# Patient Record
Sex: Female | Born: 1980 | Race: Black or African American | Hispanic: No | Marital: Single | Smoking: Never smoker
Health system: Southern US, Community
[De-identification: ages and names within clinical notes are randomized; demographics above are authoritative.]

## PROBLEM LIST (undated history)

## (undated) ENCOUNTER — Inpatient Hospital Stay (HOSPITAL_COMMUNITY): Payer: Self-pay

## (undated) DIAGNOSIS — R42 Dizziness and giddiness: Secondary | ICD-10-CM

## (undated) DIAGNOSIS — O3430 Maternal care for cervical incompetence, unspecified trimester: Secondary | ICD-10-CM

## (undated) DIAGNOSIS — O4700 False labor before 37 completed weeks of gestation, unspecified trimester: Secondary | ICD-10-CM

## (undated) DIAGNOSIS — H6093 Unspecified otitis externa, bilateral: Secondary | ICD-10-CM

## (undated) DIAGNOSIS — N760 Acute vaginitis: Secondary | ICD-10-CM

## (undated) DIAGNOSIS — R51 Headache: Secondary | ICD-10-CM

## (undated) DIAGNOSIS — O4690 Antepartum hemorrhage, unspecified, unspecified trimester: Secondary | ICD-10-CM

## (undated) DIAGNOSIS — N390 Urinary tract infection, site not specified: Secondary | ICD-10-CM

## (undated) DIAGNOSIS — B9689 Other specified bacterial agents as the cause of diseases classified elsewhere: Secondary | ICD-10-CM

## (undated) DIAGNOSIS — A599 Trichomoniasis, unspecified: Secondary | ICD-10-CM

## (undated) HISTORY — PX: CRYOABLATION: SHX1415

---

## 2003-11-01 ENCOUNTER — Emergency Department (HOSPITAL_COMMUNITY): Admission: EM | Admit: 2003-11-01 | Discharge: 2003-11-01 | Payer: Self-pay | Admitting: Emergency Medicine

## 2003-11-08 ENCOUNTER — Ambulatory Visit: Payer: Self-pay | Admitting: *Deleted

## 2003-11-19 ENCOUNTER — Ambulatory Visit: Payer: Self-pay | Admitting: Obstetrics and Gynecology

## 2004-05-12 ENCOUNTER — Emergency Department (HOSPITAL_COMMUNITY): Admission: EM | Admit: 2004-05-12 | Discharge: 2004-05-13 | Payer: Self-pay | Admitting: Emergency Medicine

## 2004-10-24 ENCOUNTER — Emergency Department (HOSPITAL_COMMUNITY): Admission: EM | Admit: 2004-10-24 | Discharge: 2004-10-25 | Payer: Self-pay | Admitting: Emergency Medicine

## 2004-11-05 ENCOUNTER — Emergency Department (HOSPITAL_COMMUNITY): Admission: EM | Admit: 2004-11-05 | Discharge: 2004-11-05 | Payer: Self-pay | Admitting: Emergency Medicine

## 2005-04-17 ENCOUNTER — Emergency Department (HOSPITAL_COMMUNITY): Admission: EM | Admit: 2005-04-17 | Discharge: 2005-04-17 | Payer: Self-pay | Admitting: Emergency Medicine

## 2005-07-22 ENCOUNTER — Emergency Department (HOSPITAL_COMMUNITY): Admission: EM | Admit: 2005-07-22 | Discharge: 2005-07-23 | Payer: Self-pay | Admitting: Emergency Medicine

## 2005-09-04 ENCOUNTER — Emergency Department (HOSPITAL_COMMUNITY): Admission: EM | Admit: 2005-09-04 | Discharge: 2005-09-04 | Payer: Self-pay | Admitting: Emergency Medicine

## 2005-12-01 ENCOUNTER — Emergency Department (HOSPITAL_COMMUNITY): Admission: EM | Admit: 2005-12-01 | Discharge: 2005-12-02 | Payer: Self-pay | Admitting: Emergency Medicine

## 2006-01-07 ENCOUNTER — Emergency Department (HOSPITAL_COMMUNITY): Admission: EM | Admit: 2006-01-07 | Discharge: 2006-01-07 | Payer: Self-pay | Admitting: Emergency Medicine

## 2006-02-18 DIAGNOSIS — I10 Essential (primary) hypertension: Secondary | ICD-10-CM

## 2006-07-12 ENCOUNTER — Emergency Department (HOSPITAL_COMMUNITY): Admission: EM | Admit: 2006-07-12 | Discharge: 2006-07-12 | Payer: Self-pay | Admitting: Emergency Medicine

## 2006-09-13 ENCOUNTER — Ambulatory Visit: Payer: Self-pay | Admitting: Internal Medicine

## 2006-09-13 DIAGNOSIS — R1013 Epigastric pain: Secondary | ICD-10-CM | POA: Insufficient documentation

## 2006-09-13 DIAGNOSIS — R0602 Shortness of breath: Secondary | ICD-10-CM

## 2006-09-13 LAB — CONVERTED CEMR LAB
ALT: 12 units/L (ref 0–35)
AST: 21 units/L (ref 0–37)
Albumin: 4.6 g/dL (ref 3.5–5.2)
Alkaline Phosphatase: 51 units/L (ref 39–117)
BUN: 11 mg/dL (ref 6–23)
Basophils Absolute: 0 10*3/uL (ref 0.0–0.1)
Basophils Relative: 0 % (ref 0–1)
CO2: 17 meq/L — ABNORMAL LOW (ref 19–32)
Calcium: 9.1 mg/dL (ref 8.4–10.5)
Chloride: 104 meq/L (ref 96–112)
Cholesterol: 174 mg/dL (ref 0–200)
Creatinine, Ser: 0.67 mg/dL (ref 0.40–1.20)
Eosinophils Absolute: 0.1 10*3/uL (ref 0.0–0.7)
Eosinophils Relative: 2 % (ref 0–5)
Glucose, Bld: 75 mg/dL (ref 70–99)
HCT: 37.5 % (ref 36.0–46.0)
HDL: 43 mg/dL (ref >39)
Hemoglobin: 12.1 g/dL (ref 12.0–15.0)
LDL Cholesterol: 117 mg/dL — ABNORMAL HIGH (ref 0–99)
Lymphocytes Relative: 34 % (ref 12–46)
Lymphs Abs: 2.4 10*3/uL (ref 0.7–3.3)
MCHC: 32.3 g/dL (ref 30.0–36.0)
MCV: 80.5 fL (ref 78.0–100.0)
Monocytes Absolute: 0.7 10*3/uL (ref 0.2–0.7)
Monocytes Relative: 9 % (ref 3–11)
Neutro Abs: 3.9 10*3/uL (ref 1.7–7.7)
Neutrophils Relative %: 55 % (ref 43–77)
Platelets: 192 10*3/uL (ref 150–400)
Potassium: 4.6 meq/L (ref 3.5–5.3)
RBC: 4.66 M/uL (ref 3.87–5.11)
RDW: 15.6 % — ABNORMAL HIGH (ref 11.5–14.0)
Sodium: 138 meq/L (ref 135–145)
Total Bilirubin: 0.5 mg/dL (ref 0.3–1.2)
Total CHOL/HDL Ratio: 4
Total Protein: 7.9 g/dL (ref 6.0–8.3)
Triglycerides: 68 mg/dL (ref <150)
VLDL: 14 mg/dL (ref 0–40)
WBC: 7.2 10*3/uL (ref 4.0–10.5)

## 2006-09-21 ENCOUNTER — Ambulatory Visit: Payer: Self-pay | Admitting: *Deleted

## 2007-01-05 ENCOUNTER — Telehealth (INDEPENDENT_AMBULATORY_CARE_PROVIDER_SITE_OTHER): Payer: Self-pay | Admitting: Internal Medicine

## 2009-08-20 ENCOUNTER — Encounter (INDEPENDENT_AMBULATORY_CARE_PROVIDER_SITE_OTHER): Payer: Self-pay | Admitting: Internal Medicine

## 2009-12-25 ENCOUNTER — Emergency Department (HOSPITAL_COMMUNITY): Admission: EM | Admit: 2009-12-25 | Discharge: 2009-08-18 | Payer: Self-pay | Admitting: Emergency Medicine

## 2010-02-17 NOTE — Letter (Signed)
Summary: FAXED REQUESTED RECORDS TO [facility] DE[facility].  FAXED REQUESTED RECORDS TO [facility] DEPT.   Imported By: Arta Bruce 09/10/2009 15:16:17  _____________________________________________________________________  External Attachment:    Type:   Image     Comment:   External Document

## 2010-04-03 LAB — DIFFERENTIAL
Basophils Absolute: 0 10*3/uL (ref 0.0–0.1)
Basophils Relative: 0 % (ref 0–1)
Eosinophils Absolute: 0 10*3/uL (ref 0.0–0.7)
Eosinophils Relative: 1 % (ref 0–5)
Lymphocytes Relative: 35 % (ref 12–46)
Lymphs Abs: 1.7 10*3/uL (ref 0.7–4.0)
Monocytes Absolute: 0.3 10*3/uL (ref 0.1–1.0)
Monocytes Relative: 6 % (ref 3–12)
Neutro Abs: 2.8 10*3/uL (ref 1.7–7.7)
Neutrophils Relative %: 59 % (ref 43–77)

## 2010-04-03 LAB — POCT CARDIAC MARKERS
CKMB, poc: 3.5 ng/mL (ref 1.0–8.0)
Myoglobin, poc: 110 ng/mL (ref 12–200)
Troponin i, poc: <0.05 ng/mL (ref 0.00–0.09)

## 2010-04-03 LAB — CBC
HCT: 33.5 % — ABNORMAL LOW (ref 36.0–46.0)
Hemoglobin: 11.8 g/dL — ABNORMAL LOW (ref 12.0–15.0)
MCH: 27.6 pg (ref 26.0–34.0)
MCHC: 35.2 g/dL (ref 30.0–36.0)
MCV: 78.6 fL (ref 78.0–100.0)
Platelets: 253 10*3/uL (ref 150–400)
RBC: 4.27 MIL/uL (ref 3.87–5.11)
RDW: 14.6 % (ref 11.5–15.5)
WBC: 4.8 10*3/uL (ref 4.0–10.5)

## 2010-04-03 LAB — POCT I-STAT, CHEM 8
BUN: 8 mg/dL (ref 6–23)
Calcium, Ion: 1.12 mmol/L (ref 1.12–1.32)
Chloride: 103 mEq/L (ref 96–112)
Creatinine, Ser: 0.7 mg/dL (ref 0.4–1.2)
Glucose, Bld: 90 mg/dL (ref 70–99)
HCT: 36 % (ref 36.0–46.0)
Hemoglobin: 12.2 g/dL (ref 12.0–15.0)
Potassium: 3.3 mEq/L — ABNORMAL LOW (ref 3.5–5.1)
Sodium: 138 mEq/L (ref 135–145)
TCO2: 23 mmol/L (ref 0–100)

## 2010-04-03 LAB — POCT PREGNANCY, URINE: Preg Test, Ur: NEGATIVE

## 2010-04-03 LAB — GLUCOSE, CAPILLARY: Glucose-Capillary: 88 mg/dL (ref 70–99)

## 2010-05-01 ENCOUNTER — Emergency Department (HOSPITAL_COMMUNITY): Payer: Self-pay

## 2010-05-01 ENCOUNTER — Emergency Department (HOSPITAL_COMMUNITY)
Admission: EM | Admit: 2010-05-01 | Discharge: 2010-05-01 | Disposition: A | Discharge status: Home / Self Care | Payer: Self-pay | Attending: Emergency Medicine | Admitting: Emergency Medicine

## 2010-05-01 DIAGNOSIS — H8110 Benign paroxysmal vertigo, unspecified ear: Secondary | ICD-10-CM | POA: Insufficient documentation

## 2010-05-01 DIAGNOSIS — I10 Essential (primary) hypertension: Secondary | ICD-10-CM | POA: Insufficient documentation

## 2010-05-01 DIAGNOSIS — R51 Headache: Secondary | ICD-10-CM | POA: Insufficient documentation

## 2010-05-01 DIAGNOSIS — R55 Syncope and collapse: Secondary | ICD-10-CM | POA: Insufficient documentation

## 2010-05-01 DIAGNOSIS — R232 Flushing: Secondary | ICD-10-CM | POA: Insufficient documentation

## 2010-05-01 DIAGNOSIS — R5381 Other malaise: Secondary | ICD-10-CM | POA: Insufficient documentation

## 2010-05-01 DIAGNOSIS — R110 Nausea: Secondary | ICD-10-CM | POA: Insufficient documentation

## 2010-05-01 LAB — DIFFERENTIAL
Basophils Absolute: 0 10*3/uL (ref 0.0–0.1)
Basophils Relative: 0 % (ref 0–1)
Eosinophils Absolute: 0 10*3/uL (ref 0.0–0.7)
Eosinophils Relative: 1 % (ref 0–5)
Lymphocytes Relative: 33 % (ref 12–46)
Lymphs Abs: 2 10*3/uL (ref 0.7–4.0)
Monocytes Absolute: 0.4 10*3/uL (ref 0.1–1.0)
Monocytes Relative: 6 % (ref 3–12)
Neutro Abs: 3.7 10*3/uL (ref 1.7–7.7)
Neutrophils Relative %: 61 % (ref 43–77)

## 2010-05-01 LAB — POCT CARDIAC MARKERS
CKMB, poc: 1.9 ng/mL (ref 1.0–8.0)
Myoglobin, poc: 81.8 ng/mL (ref 12–200)
Troponin i, poc: <0.05 ng/mL (ref 0.00–0.09)

## 2010-05-01 LAB — BASIC METABOLIC PANEL
BUN: 8 mg/dL (ref 6–23)
CO2: 27 mEq/L (ref 19–32)
Calcium: 9 mg/dL (ref 8.4–10.5)
Chloride: 102 mEq/L (ref 96–112)
Creatinine, Ser: 0.73 mg/dL (ref 0.4–1.2)
GFR calc Af Amer: >60 mL/min (ref >60)
GFR calc non Af Amer: >60 mL/min (ref >60)
Glucose, Bld: 97 mg/dL (ref 70–99)
Potassium: 3.7 mEq/L (ref 3.5–5.1)
Sodium: 136 mEq/L (ref 135–145)

## 2010-05-01 LAB — CBC
HCT: 34.7 % — ABNORMAL LOW (ref 36.0–46.0)
Hemoglobin: 11.4 g/dL — ABNORMAL LOW (ref 12.0–15.0)
MCH: 26 pg (ref 26.0–34.0)
MCHC: 32.9 g/dL (ref 30.0–36.0)
MCV: 79 fL (ref 78.0–100.0)
Platelets: 227 10*3/uL (ref 150–400)
RBC: 4.39 MIL/uL (ref 3.87–5.11)
RDW: 15.3 % (ref 11.5–15.5)
WBC: 6.1 10*3/uL (ref 4.0–10.5)

## 2010-06-05 NOTE — Group Therapy Note (Signed)
Priscilla Powell, Priscilla Powell                ACCOUNT NO.:  [id number]   MEDICAL RECORD NO.:  [id number]          PATIENT TYPE:  WOC   LOCATION:  [facility]                   FACILITY:  [facility]   PHYSICIAN:  Ellis Parents, MD    DATE OF BIRTH:  05/22/1980   DATE OF SERVICE:  11/08/2003                                    CLINIC NOTE   REASON FOR VISIT:  This 30 year old nulliparous female is referred from  [facility] for treatment for condyloma.  The patient  states she has had these warts on her vulva for about 4-5 months.  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  The patient is scheduled to return to  that clinic for a repeat Pap smear in January 2006.   PHYSICAL EXAMINATION:  External genitalia show flattened groups of condyloma  on the upper part of the perineum and lower vulva on either side  encompassing an area of approximately 2-3 cm bilaterally.  There are also  some small condyloma in the perirectal area, especially on the right.  The  vagina is clean, the cervix is nulliparous and clean, there is no evidence  of condyloma intravaginally.  TCA 80% was applied to the condyloma.   The patient is to return in 10 days for repeat TCA.      SA/MEDQ  D:  11/08/2003  T:  11/08/2003  Job:  [id number]

## 2010-06-17 ENCOUNTER — Inpatient Hospital Stay (INDEPENDENT_AMBULATORY_CARE_PROVIDER_SITE_OTHER)
Admission: RE | Admit: 2010-06-17 | Discharge: 2010-06-17 | Disposition: A | Discharge status: Home / Self Care | Payer: Self-pay | Source: Ambulatory Visit | Attending: Family Medicine | Admitting: Family Medicine

## 2010-06-17 DIAGNOSIS — R0789 Other chest pain: Secondary | ICD-10-CM

## 2010-08-27 ENCOUNTER — Emergency Department (HOSPITAL_COMMUNITY)
Admission: EM | Admit: 2010-08-27 | Discharge: 2010-08-27 | Disposition: A | Discharge status: Home / Self Care | Payer: Self-pay | Attending: Emergency Medicine | Admitting: Emergency Medicine

## 2010-08-27 DIAGNOSIS — K047 Periapical abscess without sinus: Secondary | ICD-10-CM | POA: Insufficient documentation

## 2010-08-27 DIAGNOSIS — R51 Headache: Secondary | ICD-10-CM | POA: Insufficient documentation

## 2010-08-27 DIAGNOSIS — K089 Disorder of teeth and supporting structures, unspecified: Secondary | ICD-10-CM | POA: Insufficient documentation

## 2010-11-19 ENCOUNTER — Inpatient Hospital Stay (INDEPENDENT_AMBULATORY_CARE_PROVIDER_SITE_OTHER)
Admission: RE | Admit: 2010-11-19 | Discharge: 2010-11-19 | Disposition: A | Discharge status: Home / Self Care | Payer: Self-pay | Source: Ambulatory Visit | Attending: Family Medicine | Admitting: Family Medicine

## 2010-11-19 DIAGNOSIS — K047 Periapical abscess without sinus: Secondary | ICD-10-CM

## 2011-01-09 ENCOUNTER — Emergency Department (HOSPITAL_COMMUNITY): Payer: Self-pay

## 2011-01-09 ENCOUNTER — Emergency Department (HOSPITAL_COMMUNITY)
Admission: EM | Admit: 2011-01-09 | Discharge: 2011-01-09 | Disposition: A | Discharge status: Home / Self Care | Payer: Self-pay | Attending: Emergency Medicine | Admitting: Emergency Medicine

## 2011-01-09 ENCOUNTER — Encounter: Payer: Self-pay | Admitting: *Deleted

## 2011-01-09 DIAGNOSIS — R109 Unspecified abdominal pain: Secondary | ICD-10-CM | POA: Insufficient documentation

## 2011-01-09 DIAGNOSIS — M25569 Pain in unspecified knee: Secondary | ICD-10-CM | POA: Insufficient documentation

## 2011-01-09 DIAGNOSIS — M25561 Pain in right knee: Secondary | ICD-10-CM

## 2011-01-09 DIAGNOSIS — Z87828 Personal history of other (healed) physical injury and trauma: Secondary | ICD-10-CM | POA: Insufficient documentation

## 2011-01-09 DIAGNOSIS — M549 Dorsalgia, unspecified: Secondary | ICD-10-CM | POA: Insufficient documentation

## 2011-01-09 DIAGNOSIS — R10812 Left upper quadrant abdominal tenderness: Secondary | ICD-10-CM | POA: Insufficient documentation

## 2011-01-09 DIAGNOSIS — M25562 Pain in left knee: Secondary | ICD-10-CM

## 2011-01-09 LAB — COMPREHENSIVE METABOLIC PANEL
ALT: 11 U/L (ref 0–35)
AST: 16 U/L (ref 0–37)
Albumin: 4.1 g/dL (ref 3.5–5.2)
Alkaline Phosphatase: 56 U/L (ref 39–117)
BUN: 13 mg/dL (ref 6–23)
CO2: 26 mEq/L (ref 19–32)
Calcium: 9.6 mg/dL (ref 8.4–10.5)
Chloride: 103 mEq/L (ref 96–112)
Creatinine, Ser: 0.75 mg/dL (ref 0.50–1.10)
GFR calc Af Amer: >90 mL/min (ref >90)
GFR calc non Af Amer: >90 mL/min (ref >90)
Glucose, Bld: 84 mg/dL (ref 70–99)
Potassium: 4 mEq/L (ref 3.5–5.1)
Sodium: 138 mEq/L (ref 135–145)
Total Bilirubin: 0.3 mg/dL (ref 0.3–1.2)
Total Protein: 8.1 g/dL (ref 6.0–8.3)

## 2011-01-09 LAB — DIFFERENTIAL
Basophils Absolute: 0 10*3/uL (ref 0.0–0.1)
Basophils Relative: 0 % (ref 0–1)
Eosinophils Absolute: 0 10*3/uL (ref 0.0–0.7)
Eosinophils Relative: 0 % (ref 0–5)
Lymphocytes Relative: 33 % (ref 12–46)
Lymphs Abs: 2.1 10*3/uL (ref 0.7–4.0)
Monocytes Absolute: 0.6 10*3/uL (ref 0.1–1.0)
Monocytes Relative: 9 % (ref 3–12)
Neutro Abs: 3.7 10*3/uL (ref 1.7–7.7)
Neutrophils Relative %: 57 % (ref 43–77)

## 2011-01-09 LAB — CBC
HCT: 33.7 % — ABNORMAL LOW (ref 36.0–46.0)
Hemoglobin: 11.2 g/dL — ABNORMAL LOW (ref 12.0–15.0)
MCH: 26.2 pg (ref 26.0–34.0)
MCHC: 33.2 g/dL (ref 30.0–36.0)
MCV: 78.7 fL (ref 78.0–100.0)
Platelets: 261 10*3/uL (ref 150–400)
RBC: 4.28 MIL/uL (ref 3.87–5.11)
RDW: 15.2 % (ref 11.5–15.5)
WBC: 6.4 10*3/uL (ref 4.0–10.5)

## 2011-01-09 LAB — LIPASE, BLOOD: Lipase: 51 U/L (ref 11–59)

## 2011-01-09 MED ORDER — HYDROCODONE-ACETAMINOPHEN 5-325 MG PO TABS
1.0000 | ORAL_TABLET | ORAL | Status: AC | PRN
Start: 2011-01-09 — End: 2011-01-19

## 2011-01-09 MED ORDER — HYDROCODONE-ACETAMINOPHEN 5-325 MG PO TABS
1.0000 | ORAL_TABLET | Freq: Once | ORAL | Status: AC
  Administered 2011-01-09: 1 via ORAL
  Filled 2011-01-09: qty 1
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
## 2011-01-09 NOTE — ED Provider Notes (Signed)
Medical screening examination/treatment/procedure(s) were performed by non-physician practitioner and as supervising physician I was immediately available for consultation/collaboration.   Fredda Clarida L Angelli Baruch, MD 01/09/11 0701 

## 2011-01-09 NOTE — ED Notes (Signed)
Rx given to pt. 

## 2011-01-09 NOTE — ED Notes (Signed)
Pt ambulated to restroom, gait was even and steady

## 2011-02-25 ENCOUNTER — Encounter (HOSPITAL_COMMUNITY): Payer: Self-pay | Admitting: Emergency Medicine

## 2011-02-25 ENCOUNTER — Emergency Department (INDEPENDENT_AMBULATORY_CARE_PROVIDER_SITE_OTHER)
Admission: EM | Admit: 2011-02-25 | Discharge: 2011-02-25 | Disposition: A | Discharge status: Home / Self Care | Payer: Self-pay | Source: Home / Self Care | Attending: Family Medicine | Admitting: Family Medicine

## 2011-02-25 DIAGNOSIS — G43909 Migraine, unspecified, not intractable, without status migrainosus: Secondary | ICD-10-CM

## 2011-02-25 DIAGNOSIS — H8110 Benign paroxysmal vertigo, unspecified ear: Secondary | ICD-10-CM

## 2011-02-25 HISTORY — DX: Dizziness and giddiness: R42

## 2011-02-25 HISTORY — DX: Headache: R51

## 2011-02-25 MED ORDER — KETOROLAC TROMETHAMINE 30 MG/ML IJ SOLN
30.0000 mg | Freq: Once | INTRAMUSCULAR | Status: AC
  Administered 2011-02-25: 30 mg via INTRAMUSCULAR

## 2011-02-25 MED ORDER — KETOROLAC TROMETHAMINE 30 MG/ML IJ SOLN
INTRAMUSCULAR | Status: AC
  Filled 2011-02-25: qty 1

## 2011-02-25 MED ORDER — ONDANSETRON 4 MG PO TBDP
ORAL_TABLET | ORAL | Status: AC
  Filled 2011-02-25: qty 1

## 2011-02-25 MED ORDER — ONDANSETRON HCL 4 MG PO TABS: 4.0000 mg | ORAL_TABLET | Freq: Four times a day (QID) | ORAL | Status: AC

## 2011-02-25 MED ORDER — ONDANSETRON 4 MG PO TBDP
4.0000 mg | ORAL_TABLET | Freq: Once | ORAL | Status: AC
  Administered 2011-02-25: 4 mg via ORAL

## 2011-02-25 NOTE — ED Notes (Signed)
C/o headache today.  Patient reports having vertigo for "5 months".

## 2011-02-25 NOTE — ED Provider Notes (Addendum)
History     CSN: [id number]  Arrival date & time 02/25/11  1944   First MD Initiated Contact with Patient 02/25/11 1954      Chief Complaint  Patient presents with  . Headache    (Consider location/radiation/quality/duration/timing/severity/associated sxs/prior treatment) Patient is a 31 y.o. female presenting with headaches. The history is provided by the patient.  Headache The primary symptoms include headaches, dizziness and vomiting. Primary symptoms do not include seizures, visual change, focal weakness, speech change, memory loss or fever. The symptoms began 1 to 2 hours ago. The symptoms are improving. The neurological symptoms are focal. Context: at work and sx began.  The headache is not associated with visual change, weakness or loss of balance.  Dizziness also occurs with vomiting. Dizziness does not occur with weakness.  Additional symptoms include vertigo. Additional symptoms do not include weakness or loss of balance. Associated symptoms comments: Sx similar to prior events, improved .has lmd who has treated prev..    Past Medical History  Diagnosis Date  . Vertigo   . Headache     History reviewed. No pertinent past surgical history.  History reviewed. No pertinent family history.  History  Substance Use Topics  . Smoking status: Never Smoker   . Smokeless tobacco: Not on file  . Alcohol Use: No    OB History    Grav Para Term Preterm Abortions TAB SAB Ect Mult Living                  Review of Systems  Constitutional: Negative for fever.  Eyes: Negative.   Respiratory: Negative.   Cardiovascular: Negative.   Gastrointestinal: Positive for vomiting.  Neurological: Positive for dizziness, vertigo and headaches. Negative for speech change, focal weakness, seizures, speech difficulty, weakness, numbness and loss of balance.  Psychiatric/Behavioral: Negative for memory loss.    Allergies  Review of patient's allergies indicates no known  allergies.  Home Medications   Current Outpatient Rx  Name Route Sig Dispense Refill  . ACETAMINOPHEN 325 MG PO TABS Oral Take 650 mg by mouth every 6 (six) hours as needed.    . ALEVE PO Oral Take by mouth.    . ONDANSETRON HCL 4 MG PO TABS Oral Take 1 tablet (4 mg total) by mouth every 6 (six) hours. As needed for n/v 6 tablet 0    BP 136/93  Pulse 92  Temp(Src) 98.5 F (36.9 C) (Oral)  Resp 18  SpO2 99%  LMP 02/13/2011  Physical Exam  Nursing note and vitals reviewed. Constitutional: She is oriented to person, place, and time. She appears well-developed and well-nourished.  HENT:  Head: Normocephalic.  Right Ear: External ear normal.  Left Ear: External ear normal.  Mouth/Throat: Oropharynx is clear and moist.  Eyes: Conjunctivae and EOM are normal. Pupils are equal, round, and reactive to light.  Neck: Normal range of motion. Neck supple.  Cardiovascular: Normal rate, regular rhythm, normal heart sounds and intact distal pulses.   Pulmonary/Chest: Effort normal and breath sounds normal.  Lymphadenopathy:    She has no cervical adenopathy.  Neurological: She is alert and oriented to person, place, and time. No cranial nerve deficit. Coordination normal.  Skin: Skin is warm and dry.  Psychiatric: She has a normal mood and affect.    ED Course  Procedures (including critical care time)  Labs Reviewed - No data to display No results found.   1. Vertigo, benign positional   2. Migraine headache  MDM          Barkley Bruns, MD 02/25/11 2022  Barkley Bruns, MD 02/25/11 2023

## 2011-02-25 NOTE — ED Notes (Signed)
Reports current symptoms are typical for headaches. Onset while at work.

## 2011-03-24 ENCOUNTER — Emergency Department (HOSPITAL_COMMUNITY)
Admission: EM | Admit: 2011-03-24 | Discharge: 2011-03-24 | Disposition: A | Discharge status: Home / Self Care | Payer: Self-pay | Source: Home / Self Care | Attending: Emergency Medicine | Admitting: Emergency Medicine

## 2011-03-24 ENCOUNTER — Encounter (HOSPITAL_COMMUNITY): Payer: Self-pay

## 2011-03-24 DIAGNOSIS — S335XXA Sprain of ligaments of lumbar spine, initial encounter: Secondary | ICD-10-CM

## 2011-03-24 DIAGNOSIS — S39012A Strain of muscle, fascia and tendon of lower back, initial encounter: Secondary | ICD-10-CM

## 2011-03-24 DIAGNOSIS — N739 Female pelvic inflammatory disease, unspecified: Secondary | ICD-10-CM

## 2011-03-24 LAB — POCT URINALYSIS DIP (DEVICE)
Bilirubin Urine: NEGATIVE
Ketones, ur: 15 mg/dL — AB
Leukocytes, UA: NEGATIVE
Nitrite: NEGATIVE
Protein, ur: NEGATIVE mg/dL

## 2011-03-24 LAB — WET PREP, GENITAL: Yeast Wet Prep HPF POC: NONE SEEN

## 2011-03-24 LAB — POCT PREGNANCY, URINE: Preg Test, Ur: NEGATIVE

## 2011-03-24 MED ORDER — CEFTRIAXONE SODIUM 250 MG IJ SOLR
INTRAMUSCULAR | Status: AC
  Filled 2011-03-24: qty 250

## 2011-03-24 MED ORDER — TRAMADOL HCL 50 MG PO TABS: 100.0000 mg | ORAL_TABLET | Freq: Three times a day (TID) | ORAL | Status: AC | PRN

## 2011-03-24 MED ORDER — AZITHROMYCIN 250 MG PO TABS
ORAL_TABLET | ORAL | Status: AC
  Filled 2011-03-24: qty 4

## 2011-03-24 MED ORDER — METHOCARBAMOL 500 MG PO TABS: 500.0000 mg | ORAL_TABLET | Freq: Three times a day (TID) | ORAL | Status: AC

## 2011-03-24 MED ORDER — CEFTRIAXONE SODIUM 250 MG IJ SOLR
250.0000 mg | Freq: Once | INTRAMUSCULAR | Status: AC
  Administered 2011-03-24: 250 mg via INTRAMUSCULAR

## 2011-03-24 MED ORDER — AZITHROMYCIN 250 MG PO TABS
1000.0000 mg | ORAL_TABLET | Freq: Once | ORAL | Status: AC
  Administered 2011-03-24: 1000 mg via ORAL

## 2011-03-24 MED ORDER — METRONIDAZOLE 500 MG PO TABS: 500.0000 mg | ORAL_TABLET | Freq: Two times a day (BID) | ORAL | Status: AC

## 2011-03-24 MED ORDER — LIDOCAINE HCL (PF) 1 % IJ SOLN
INTRAMUSCULAR | Status: AC
  Filled 2011-03-24: qty 5

## 2011-03-24 MED ORDER — AZITHROMYCIN 500 MG PO TABS: ORAL_TABLET | ORAL | Status: DC

## 2011-03-24 NOTE — Discharge Instructions (Signed)
Back Exercises Back exercises help treat and prevent back injuries. The goal of back exercises is to increase the strength of your abdominal and back muscles and the flexibility of your back. These exercises should be started when you no longer have back pain. Back exercises include:  Pelvic Tilt. Lie on your back with your knees bent. Tilt your pelvis until the lower part of your back is against the floor. Hold this position 5 to 10 sec and repeat 5 to 10 times.   Knee to Chest. Pull first 1 knee up against your chest and hold for 20 to 30 seconds, repeat this with the other knee, and then both knees. This may be done with the other leg straight or bent, whichever feels better.   Sit-Ups or Curl-Ups. Bend your knees 90 degrees. Start with tilting your pelvis, and do a partial, slow sit-up, lifting your trunk only 30 to 45 degrees off the floor. Take at least 2 to 3 seconds for each sit-up. Do not do sit-ups with your knees out straight. If partial sit-ups are difficult, simply do the above but with only tightening your abdominal muscles and holding it as directed.   Hip-Lift. Lie on your back with your knees flexed 90 degrees. Push down with your feet and shoulders as you raise your hips a couple inches off the floor; hold for 10 seconds, repeat 5 to 10 times.   Back arches. Lie on your stomach, propping yourself up on bent elbows. Slowly press on your hands, causing an arch in your low back. Repeat 3 to 5 times. Any initial stiffness and discomfort should lessen with repetition over time.   Shoulder-Lifts. Lie face down with arms beside your body. Keep hips and torso pressed to floor as you slowly lift your head and shoulders off the floor.  Do not overdo your exercises, especially in the beginning. Exercises may cause you some mild back discomfort which lasts for a few minutes; however, if the pain is more severe, or lasts for more than 15 minutes, do not continue exercises until you see your  caregiver. Improvement with exercise therapy for back problems is slow.  See your caregivers for assistance with developing a proper back exercise program. Document Released: 02/12/2004 Document Revised: 12/24/2010 Document Reviewed: 01/04/2005 [facility] Patient Information 2012 [place], [place].Pelvic Inflammatory Disease Pelvic Inflammatory Disease (PID) is an infection in some or all of your female organs. This includes the womb (uterus), ovaries, fallopian tubes and tissues in the pelvis. PID is a common cause of sudden onset (acute) lower abdominal (pelvic) pain. PID can be treated, but it is a serious infection. It may take weeks before you are completely well. In some cases, hospitalization is needed for surgery or to administer medications to kill germs (antibiotics) through your veins (intravenously). CAUSES   It may be caused by germs that are spread during sexual contact.   PID can also occur following:   The birth of a baby.   A miscarriage.   An abortion.   Major surgery of the pelvis.   Use of an IUD.   Sexual assault.  SYMPTOMS   Abdominal or pelvic pain.   Fever.   Chills.   Abnormal vaginal discharge.  DIAGNOSIS  Your caregiver will choose some of these methods to make a diagnosis:  A physical exam and history.   Blood tests.   Cultures of the vagina and cervix.   X-rays or ultrasound.   A procedure to look inside the pelvis (laparoscopy).  TREATMENT   Use of antibiotics by mouth or intravenously.   Treatment of sexual partners when the infection is an sexually transmitted disease (STD).   Hospitalization and surgery may be needed.  RISKS AND COMPLICATIONS   PID can cause women to become unable to have children (sterile) if left untreated or if partially treated. That is why it is important to finish all medications given to you.   Sterility or future tubal (ectopic) pregnancies can occur in fully treated individuals. This is why it is so important  to follow your prescribed treatment.   It can cause longstanding (chronic) pelvic pain after frequent infections.   Painful intercourse.   Pelvic abscesses.   In rare cases, surgery or a hysterectomy may be needed.   If this is a sexually transmitted infection (STI), you are also at risk for any other STD including AIDSor human papillomavirus (HPV).  HOME CARE INSTRUCTIONS   Finish all medication as prescribed. Incomplete treatment will put you at risk for sterility and tubal pregnancy.   Only take over-the-counter or prescription medicines for pain, discomfort, or fever as directed by your caregiver.   Do not have sex until treatment is completed or as directed by your caregiver. If PID is confirmed, your recent sexual contacts will need treatment.   Keep your follow-up appointments.  SEEK MEDICAL CARE IF:   You have increased or abnormal vaginal discharge.   You need prescription medication for your pain.   Your partner has an STD.   You are vomiting.   You cannot take your medications.  SEEK IMMEDIATE MEDICAL CARE IF:   You have a fever.   You develop increased abdominal or pelvic pain.   You develop chills.   You have pain when you urinate.   You are not better after 72 hours following treatment.  Document Released: 01/04/2005 Document Revised: 12/24/2010 Document Reviewed: 09/17/2006 [facility] Patient Information 2012 [place], [place].

## 2011-03-24 NOTE — ED Notes (Signed)
C/o pain low back and into right groin since yesterday, urine changed color, nausea; NAD

## 2011-03-24 NOTE — ED Provider Notes (Signed)
History     CSN: [id number]  Arrival date & time 03/24/11  8295   First MD Initiated Contact with Patient 03/24/11 1930      Chief Complaint  Patient presents with  . Abdominal Pain    (Consider location/radiation/quality/duration/timing/severity/associated sxs/prior treatment) HPI Comments: Priscilla Powell is a 31 year old female who presents tonight with 2 problems: Lower back pain and lower abdominal pain.  Her lower back pain began last night. She cannot recall any immediate injury, although she does recall she twisted her back at work 5 days ago helping a patient in the shower. The pain is localized to the entire lower back without radiation. There is some stiffness. This is worse with movement. She does have a history of a back strain this past August and was out of work for several days. She denies any numbness, tingling, or muscle weakness in the lower extremities.  She also has had a one-month history of lower abdominal pain which is worse in the last 2 days. The pain is located in the bilateral pelvic area slightly worse on the right than the left. It's worse when she sits and radiates to the vaginal area. She has had some vaginal odor and discharge. She's felt nauseated but she attributes this to use of diet pills. She has a history of Chlamydia. Her last menstrual period was February 28 although was somewhat irregular in that it started stopped and started again. She uses condoms intermittently for birth control. She denies fever, chills, vomiting, dysuria, frequency, urgency. She does note that her urine has been dark colored.  Patient is a 31 y.o. female presenting with abdominal pain.  Abdominal Pain The primary symptoms of the illness include abdominal pain, nausea and vaginal discharge. The primary symptoms of the illness do not include fever, vomiting, diarrhea, dysuria or vaginal bleeding.  The vaginal discharge is not associated with dysuria or dyspareunia.  Additional symptoms  associated with the illness include back pain. Symptoms associated with the illness do not include chills, urgency, hematuria or frequency.    Past Medical History  Diagnosis Date  . Vertigo   . Headache     History reviewed. No pertinent past surgical history.  History reviewed. No pertinent family history.  History  Substance Use Topics  . Smoking status: Never Smoker   . Smokeless tobacco: Not on file  . Alcohol Use: No    OB History    Grav Para Term Preterm Abortions TAB SAB Ect Mult Living                  Review of Systems  Constitutional: Negative for fever, chills and unexpected weight change.  Gastrointestinal: Positive for nausea and abdominal pain. Negative for vomiting and diarrhea.  Genitourinary: Positive for vaginal discharge. Negative for dysuria, urgency, frequency, hematuria, vaginal bleeding, difficulty urinating, genital sores, vaginal pain, menstrual problem, pelvic pain and dyspareunia.  Musculoskeletal: Positive for back pain. Negative for myalgias, joint swelling, arthralgias and gait problem.  Neurological: Negative for weakness and numbness.    Allergies  Review of patient's allergies indicates no known allergies.  Home Medications   Current Outpatient Rx  Name Route Sig Dispense Refill  . ACETAMINOPHEN 325 MG PO TABS Oral Take 650 mg by mouth every 6 (six) hours as needed.    . AZITHROMYCIN 500 MG PO TABS  Take 2 tablets, both at 1 time on 03/31/11. 2 tablet 0  . METHOCARBAMOL 500 MG PO TABS Oral Take 1 tablet (500 mg total) by mouth  3 (three) times daily. 30 tablet 0  . METRONIDAZOLE 500 MG PO TABS Oral Take 1 tablet (500 mg total) by mouth 2 (two) times daily. 28 tablet 0  . ALEVE PO Oral Take by mouth.    . TRAMADOL HCL 50 MG PO TABS Oral Take 2 tablets (100 mg total) by mouth every 8 (eight) hours as needed for pain. 30 tablet 0    BP 145/97  Pulse 100  Temp(Src) 98.2 F (36.8 C) (Oral)  Resp 16  SpO2 100%  LMP  03/19/2011  Physical Exam  Nursing note and vitals reviewed. Constitutional: She is oriented to person, place, and time. She appears well-developed and well-nourished. No distress.  Cardiovascular: Normal rate, regular rhythm, normal heart sounds and intact distal pulses.  Exam reveals no gallop and no friction rub.   No murmur heard. Pulmonary/Chest: Effort normal and breath sounds normal. No respiratory distress. She has no wheezes. She has no rales.  Abdominal: Soft. Bowel sounds are normal. She exhibits no distension, no abdominal bruit, no pulsatile midline mass and no mass. There is tenderness (there is tenderness to palpation in the suprapubic area, left lower quadrant, left flank, and epigastrium without guarding or rebound). There is no rebound and no guarding.  Genitourinary: Vagina normal and uterus normal. There is no rash or lesion on the right labia. There is no rash or lesion on the left labia. Uterus is not deviated, not enlarged, not fixed and not tender. Cervix exhibits no motion tenderness, no discharge and no friability. Right adnexum displays no mass and no tenderness. Left adnexum displays no mass and no tenderness. No erythema, tenderness or bleeding around the vagina. No foreign body around the vagina. No vaginal discharge found.       Pelvic exam reveals normal external genitalia. Vaginal and cervical mucosa were normal. She has mild vertical motion this. Uterus was normal in size and was mildly tender. She has mild bilateral adnexal tenderness without a mass.  Musculoskeletal: She exhibits no edema and no tenderness.       Lumbar back: She exhibits decreased range of motion, tenderness, bony tenderness and pain. She exhibits no swelling, no edema, no deformity, no spasm and normal pulse.       Exam of lower back reveals bilateral lower lumbar pain to palpation. The back has limited range of motion with pain. Straight leg raising on the left produced some back pain but no  radiating pain. Straight leg raising on the right was negative.  Neurological: She is alert and oriented to person, place, and time. She has normal reflexes. She displays no atrophy. No sensory deficit. She exhibits normal muscle tone. Coordination and gait normal.  Skin: Skin is warm and dry. No rash noted. She is not diaphoretic.    ED Course  Procedures (including critical care time)  Results for orders placed during the hospital encounter of 03/24/11  POCT URINALYSIS DIP (DEVICE)      Component Value Range   Glucose, UA NEGATIVE  NEGATIVE (mg/dL)   Bilirubin Urine NEGATIVE  NEGATIVE    Ketones, ur 15 (*) NEGATIVE (mg/dL)   Specific Gravity, Urine 1.020  1.005 - 1.030    Hgb urine dipstick NEGATIVE  NEGATIVE    pH 7.0  5.0 - 8.0    Protein, ur NEGATIVE  NEGATIVE (mg/dL)   Urobilinogen, UA 2.0 (*) 0.0 - 1.0 (mg/dL)   Nitrite NEGATIVE  NEGATIVE    Leukocytes, UA NEGATIVE  NEGATIVE   POCT PREGNANCY, URINE  Component Value Range   Preg Test, Ur NEGATIVE  NEGATIVE      Labs Reviewed  POCT URINALYSIS DIP (DEVICE) - Abnormal; Notable for the following:    Ketones, ur 15 (*)    Urobilinogen, UA 2.0 (*)    All other components within normal limits  POCT PREGNANCY, URINE  GC/CHLAMYDIA PROBE AMP, GENITAL  WET PREP, GENITAL  URINE CULTURE   No results found.   1. Lumbar strain   2. Pelvic inflammatory disease       MDM  She appears to have 2 separate problems, the first being lumbar strain and will treat this with Robaxin and back exercises. She was given a note to stay out of work tomorrow and return again on Friday. I encouraged her to try to be as active as possible.  The second problem appears to be pelvic inflammatory disease. She was given tonight Rocephin 250 mg IM and azithromycin 1000 mg by mouth. She's to take another 1000 mg of azithromycin and a week and she was given metronidazole 500 mg twice a day for 2 weeks. We'll call her about abnormal results. I did  obtain a urine culture tonight. I suggested she was no better in 2 or 3 days to return for followup. I also gave her tramadol for pain.        Roque Lias, MD 03/24/11 [phone]

## 2011-03-25 ENCOUNTER — Telehealth (HOSPITAL_COMMUNITY): Payer: Self-pay | Admitting: *Deleted

## 2011-03-25 LAB — URINE CULTURE
Colony Count: NO GROWTH
Culture  Setup Time: 201303062140
Culture: NO GROWTH

## 2011-03-25 NOTE — ED Notes (Signed)
GC/Chlamydia neg., Wet prep: Few trich., few WBC's. Pt. verified x 2 and given results. You need to notify your partner to be treated with Flagyl, no sex until you have finished your medication and your partner has been treated and to practice safe sex. You can get HIV testing at the [facility] STD clinic. Vassie Moselle 03/25/2011

## 2011-11-09 ENCOUNTER — Emergency Department (HOSPITAL_COMMUNITY): Payer: Self-pay

## 2011-11-09 ENCOUNTER — Emergency Department (HOSPITAL_COMMUNITY)
Admission: EM | Admit: 2011-11-09 | Discharge: 2011-11-09 | Disposition: A | Discharge status: Home / Self Care | Payer: Self-pay | Attending: Emergency Medicine | Admitting: Emergency Medicine

## 2011-11-09 ENCOUNTER — Encounter (HOSPITAL_COMMUNITY): Payer: Self-pay | Admitting: *Deleted

## 2011-11-09 DIAGNOSIS — R1110 Vomiting, unspecified: Secondary | ICD-10-CM | POA: Insufficient documentation

## 2011-11-09 DIAGNOSIS — Z8669 Personal history of other diseases of the nervous system and sense organs: Secondary | ICD-10-CM | POA: Insufficient documentation

## 2011-11-09 DIAGNOSIS — IMO0002 Reserved for concepts with insufficient information to code with codable children: Secondary | ICD-10-CM | POA: Insufficient documentation

## 2011-11-09 DIAGNOSIS — R300 Dysuria: Secondary | ICD-10-CM | POA: Insufficient documentation

## 2011-11-09 DIAGNOSIS — T17308A Unspecified foreign body in larynx causing other injury, initial encounter: Secondary | ICD-10-CM | POA: Insufficient documentation

## 2011-11-09 DIAGNOSIS — Y9389 Activity, other specified: Secondary | ICD-10-CM | POA: Insufficient documentation

## 2011-11-09 DIAGNOSIS — R109 Unspecified abdominal pain: Secondary | ICD-10-CM | POA: Insufficient documentation

## 2011-11-09 DIAGNOSIS — M542 Cervicalgia: Secondary | ICD-10-CM | POA: Insufficient documentation

## 2011-11-09 DIAGNOSIS — T17320A Food in larynx causing asphyxiation, initial encounter: Secondary | ICD-10-CM

## 2011-11-09 DIAGNOSIS — R55 Syncope and collapse: Secondary | ICD-10-CM | POA: Insufficient documentation

## 2011-11-09 DIAGNOSIS — Y9229 Other specified public building as the place of occurrence of the external cause: Secondary | ICD-10-CM | POA: Insufficient documentation

## 2011-11-09 LAB — URINALYSIS, ROUTINE W REFLEX MICROSCOPIC
Bilirubin Urine: NEGATIVE
Glucose, UA: NEGATIVE mg/dL
Hgb urine dipstick: NEGATIVE
Ketones, ur: NEGATIVE mg/dL
Protein, ur: NEGATIVE mg/dL
Urobilinogen, UA: 1 mg/dL (ref 0.0–1.0)

## 2011-11-09 NOTE — ED Provider Notes (Signed)
History     CSN: [id number]  Arrival date & time 11/09/11  1212   First MD Initiated Contact with Patient 11/09/11 1407      Chief Complaint  Patient presents with  . Loss of Consciousness  . Fall  . Abdominal Pain  . Emesis    (Consider location/radiation/quality/duration/timing/severity/associated sxs/prior treatment) Patient is a 31 y.o. female presenting with syncope, fall, abdominal pain, and vomiting. The history is provided by the patient and the EMS personnel. No language interpreter was used.  Loss of Consciousness This is a new problem. The current episode started today. The problem has been resolved. Associated symptoms include abdominal pain, neck pain, urinary symptoms and vomiting. Pertinent negatives include no congestion, coughing, fever, headaches, nausea, numbness, rash, sore throat, swollen glands, visual change or weakness. Associated symptoms comments: Suprapubic pain with palpatation only. Nothing aggravates the symptoms.  Fall Associated symptoms include abdominal pain and vomiting. Pertinent negatives include no visual change, no fever, no numbness, no nausea, no hematuria and no headaches.  Abdominal Pain The primary symptoms of the illness include abdominal pain, vomiting and dysuria. The primary symptoms of the illness do not include fever, nausea, vaginal discharge or vaginal bleeding.  The dysuria is not associated with hematuria, frequency, urgency or vaginal pain.  Symptoms associated with the illness do not include constipation, urgency, hematuria, frequency or back pain.  Emesis  Associated symptoms include abdominal pain. Pertinent negatives include no cough, no fever and no headaches.   31 year old female coming in after possibly aspirating on a piece of meat from between her teeth. Patient states that she was cleaning out her teeth a piece of meat came out when she went to swallow it went down "the wrong tube". She then began to  cough then vomited x3  and fell back hitting  Her head.   Denies LOC.  Patient complaining of neck pain. States that this has happened one other time before. Denies syncopal episode. States she is also having  burning with urination times a few days. Denies vaginal discharge denies unprotected sex. 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 States that after posttussive emesis she fell to the ground hit her head but denies headache presently. Neuro intact.  Past Medical History  Diagnosis Date  . Vertigo   . Headache     History reviewed. No pertinent past surgical history.  History reviewed. No pertinent family history.  History  Substance Use Topics  . Smoking status: Never Smoker   . Smokeless tobacco: Never Used  . Alcohol Use: No    OB History    Grav Para Term Preterm Abortions TAB SAB Ect Mult Living                  Review of Systems  Constitutional: Negative.  Negative for fever.  HENT: Positive for neck pain. Negative for congestion and sore throat.   Eyes: Negative.   Respiratory: Negative.  Negative for cough.   Cardiovascular: Positive for syncope.  Gastrointestinal: Positive for vomiting and abdominal pain. Negative for nausea, constipation and blood in stool.       Suprapubic pain  Genitourinary: Positive for dysuria. Negative for urgency, frequency, hematuria, vaginal bleeding, vaginal discharge, difficulty urinating and vaginal pain.  Musculoskeletal: Negative for back pain and gait problem.  Skin: Negative for rash.  Neurological: Negative.  Negative for dizziness, syncope, weakness, light-headedness, numbness and headaches.  Psychiatric/Behavioral: The patient is nervous/anxious.  All other systems reviewed and are negative.    Allergies  Review of patient's allergies indicates no known allergies.  Home Medications   Current Outpatient Rx  Name Route Sig Dispense Refill  . ACETAMINOPHEN 325 MG PO TABS Oral Take 650 mg by  mouth every 6 (six) hours as needed. Pain    . NAPROXEN 250 MG PO TABS Oral Take 250 mg by mouth as needed. Pain      BP 132/81  Pulse 90  Temp 99 F (37.2 C) (Oral)  Resp 19  SpO2 100%  LMP 11/04/2011  Physical Exam  Nursing note and vitals reviewed. Constitutional: She is oriented to person, place, and time. She appears well-developed and well-nourished.  HENT:  Head: Normocephalic and atraumatic.  Eyes: Conjunctivae normal and EOM are normal. Pupils are equal, round, and reactive to light.  Neck: Normal range of motion. Neck supple.  Cardiovascular: Normal rate and regular rhythm.   Pulmonary/Chest: Effort normal and breath sounds normal. No respiratory distress. She exhibits no tenderness.  Abdominal: Soft. Bowel sounds are normal. She exhibits no distension and no mass. There is tenderness. There is no rebound and no guarding.       Suprapubic tenderness  Genitourinary: No vaginal discharge found.  Musculoskeletal: Normal range of motion. She exhibits no edema and no tenderness.  Neurological: She is alert and oriented to person, place, and time. She has normal reflexes. No cranial nerve deficit or sensory deficit. She exhibits normal muscle tone. GCS eye subscore is 4. GCS verbal subscore is 5. GCS motor subscore is 6.  Skin: Skin is warm and dry.  Psychiatric: She has a normal mood and affect.    ED Course  Procedures (including critical care time)   Labs Reviewed  URINALYSIS, ROUTINE W REFLEX MICROSCOPIC   Dg Cervical Spine Complete  11/09/2011  *RADIOLOGY REPORT*  Clinical Data: Recent fall, loss of consciousness, neck pain  CERVICAL SPINE - COMPLETE 4+ VIEW  Comparison: None.  Findings: The cervical vertebrae are straightened in alignment. There is degenerative disc disease at C5-6 where there is some loss of disc space and anterior osteophyte formation.  No prevertebral soft tissue swelling is seen.  On oblique views, the odontoid process appears intact.  IMPRESSION:   1.  Straightened alignment.  No acute fracture. 2.  Degenerative disc disease at C5-6.   Original Report Authenticated By: Juline Patch, M.D.      No diagnosis found.    MDM  Choking on a piece of meat at work with post tussive emesis x 3 then fall.  CT cervical spine unremarkable.  Negative U/a and pregnancy test.  Chest x-ray reviewed by myself is unremarkable.  Return to ER for worsening symptoms.  Follow up with pcp of choice.        Remi Haggard, NP 11/10/11 1039

## 2011-11-09 NOTE — ED Notes (Signed)
Bed:WHALA<BR> Expected date:<BR> Expected time:<BR> Means of arrival:<BR> Comments:<BR> Fall-lower back pain-LSB

## 2011-11-09 NOTE — ED Notes (Signed)
Per EMS, pt from work with reports of emesis that pt became strangled on causing pt to pass out falling onto a tile floor, hitting head. EMS reports that pt endorses hx of same. EMS reports pt reports headache, back pain and generalized abdominal pain.

## 2011-11-09 NOTE — ED Notes (Signed)
MWU:XL24<MW> Expected date:<BR> Expected time:<BR> Means of arrival:Ambulance<BR> Comments:<BR> ALOC-rm 24

## 2011-11-11 NOTE — ED Provider Notes (Signed)
Medical screening examination/treatment/procedure(s) were conducted as a shared visit with non-physician practitioner(s) and myself.  I personally evaluated the patient during the encounter.  Patient choking episode at work followed by syncope.  She feels completely normal now.  No evidence of cardiac etiology  Donnetta Hutching, MD 11/11/11 [phone]

## 2012-01-19 NOTE — L&D Delivery Note (Signed)
Delivery Note Pt progressed to complete dilation and pushed well.  At 3:39 PM a healthy female was delivered via Vaginal, Spontaneous Delivery (Presentation: Right Occiput Posterior).  APGAR: 9, 9; weight 4 lb 11.6 oz (2143 g).  NICU team present and evaluated baby at delivery Placenta status: Intact, Spontaneous, clots delivered with placenta.  Cord: 3 vessels with the following complications: None  Anesthesia: Epidural  Episiotomy: None Lacerations: none Suture Repair: n/a Est. Blood Loss (mL): 400cc  Mom to postpartum.  Baby to NICU. Pt asked about plans regarding circumcision and would like to proceed when baby able.  Likely will do in office after d/c  Zoriah Pulice W 11/14/2012, 4:28 PM

## 2012-05-17 ENCOUNTER — Inpatient Hospital Stay (HOSPITAL_COMMUNITY): Payer: Self-pay

## 2012-05-17 ENCOUNTER — Encounter (HOSPITAL_COMMUNITY): Payer: Self-pay | Admitting: Emergency Medicine

## 2012-05-17 ENCOUNTER — Inpatient Hospital Stay (HOSPITAL_COMMUNITY)
Admission: AD | Admit: 2012-05-17 | Discharge: 2012-05-17 | Disposition: A | Discharge status: Home / Self Care | Payer: Self-pay | Source: Ambulatory Visit | Attending: Obstetrics & Gynecology | Admitting: Obstetrics & Gynecology

## 2012-05-17 ENCOUNTER — Encounter (HOSPITAL_COMMUNITY): Payer: Self-pay

## 2012-05-17 ENCOUNTER — Emergency Department (HOSPITAL_COMMUNITY)
Admission: EM | Admit: 2012-05-17 | Discharge: 2012-05-17 | Disposition: A | Discharge status: Home / Self Care | Payer: Self-pay | Attending: Emergency Medicine | Admitting: Emergency Medicine

## 2012-05-17 DIAGNOSIS — O2390 Unspecified genitourinary tract infection in pregnancy, unspecified trimester: Secondary | ICD-10-CM | POA: Insufficient documentation

## 2012-05-17 DIAGNOSIS — O210 Mild hyperemesis gravidarum: Secondary | ICD-10-CM | POA: Insufficient documentation

## 2012-05-17 DIAGNOSIS — E669 Obesity, unspecified: Secondary | ICD-10-CM | POA: Insufficient documentation

## 2012-05-17 DIAGNOSIS — Z3490 Encounter for supervision of normal pregnancy, unspecified, unspecified trimester: Secondary | ICD-10-CM

## 2012-05-17 DIAGNOSIS — O219 Vomiting of pregnancy, unspecified: Secondary | ICD-10-CM

## 2012-05-17 DIAGNOSIS — R110 Nausea: Secondary | ICD-10-CM

## 2012-05-17 DIAGNOSIS — O98819 Other maternal infectious and parasitic diseases complicating pregnancy, unspecified trimester: Secondary | ICD-10-CM | POA: Insufficient documentation

## 2012-05-17 DIAGNOSIS — A5901 Trichomonal vulvovaginitis: Secondary | ICD-10-CM | POA: Insufficient documentation

## 2012-05-17 DIAGNOSIS — R109 Unspecified abdominal pain: Secondary | ICD-10-CM | POA: Insufficient documentation

## 2012-05-17 DIAGNOSIS — A599 Trichomoniasis, unspecified: Secondary | ICD-10-CM

## 2012-05-17 DIAGNOSIS — N390 Urinary tract infection, site not specified: Secondary | ICD-10-CM | POA: Insufficient documentation

## 2012-05-17 LAB — CBC WITH DIFFERENTIAL/PLATELET
Basophils Absolute: 0 10*3/uL (ref 0.0–0.1)
Lymphocytes Relative: 23 % (ref 12–46)
Lymphs Abs: 1.1 10*3/uL (ref 0.7–4.0)
Monocytes Relative: 6 % (ref 3–12)
Platelets: ADEQUATE 10*3/uL (ref 150–400)
RDW: 15.3 % (ref 11.5–15.5)
WBC: 4.6 10*3/uL (ref 4.0–10.5)

## 2012-05-17 LAB — URINALYSIS, MICROSCOPIC ONLY
Glucose, UA: NEGATIVE mg/dL
Ketones, ur: 15 mg/dL — AB
Specific Gravity, Urine: 1.014 (ref 1.005–1.030)
pH: 6.5 (ref 5.0–8.0)

## 2012-05-17 LAB — HCG, QUANTITATIVE, PREGNANCY: hCG, Beta Chain, Quant, S: 103023 m[IU]/mL — ABNORMAL HIGH (ref <5)

## 2012-05-17 LAB — ABO/RH: ABO/RH(D): B POS

## 2012-05-17 LAB — COMPREHENSIVE METABOLIC PANEL
AST: 18 U/L (ref 0–37)
Albumin: 3.8 g/dL (ref 3.5–5.2)
Chloride: 99 mEq/L (ref 96–112)
Creatinine, Ser: 0.5 mg/dL (ref 0.50–1.10)
Total Bilirubin: 0.5 mg/dL (ref 0.3–1.2)
Total Protein: 7.6 g/dL (ref 6.0–8.3)

## 2012-05-17 LAB — OCCULT BLOOD, POC DEVICE: Fecal Occult Bld: NEGATIVE

## 2012-05-17 MED ORDER — FAMOTIDINE 20 MG PO TABS
20.0000 mg | ORAL_TABLET | Freq: Once | ORAL | Status: AC
  Administered 2012-05-17: 20 mg via ORAL
  Filled 2012-05-17: qty 1

## 2012-05-17 MED ORDER — GI COCKTAIL ~~LOC~~
30.0000 mL | Freq: Once | ORAL | Status: AC
  Administered 2012-05-17: 30 mL via ORAL
  Filled 2012-05-17: qty 30

## 2012-05-17 MED ORDER — NITROFURANTOIN MONOHYD MACRO 100 MG PO CAPS: 100.0000 mg | ORAL_CAPSULE | Freq: Two times a day (BID) | ORAL | Status: DC

## 2012-05-17 MED ORDER — ONDANSETRON 8 MG PO TBDP
8.0000 mg | ORAL_TABLET | Freq: Once | ORAL | Status: AC
  Administered 2012-05-17: 8 mg via ORAL
  Filled 2012-05-17: qty 1

## 2012-05-17 MED ORDER — ONDANSETRON HCL 8 MG PO TABS: 8.0000 mg | ORAL_TABLET | Freq: Three times a day (TID) | ORAL | Status: DC | PRN

## 2012-05-17 MED ORDER — PROMETHAZINE HCL 12.5 MG PO TABS: 12.5000 mg | ORAL_TABLET | Freq: Four times a day (QID) | ORAL | Status: DC | PRN

## 2012-05-17 MED ORDER — METRONIDAZOLE 500 MG PO TABS: 500.0000 mg | ORAL_TABLET | Freq: Two times a day (BID) | ORAL | Status: DC

## 2012-05-17 NOTE — MAU Provider Note (Signed)
History     CSN: [id number]  Arrival date and time: 05/17/12 1438   None     Chief Complaint  Patient presents with  . Abdominal Pain  . Emesis During Pregnancy   HPI Priscilla Powell is 32 y.o. G1P0 [redacted]w[redacted]d weeks presenting with upper abdominal pain with movement and vomiting.  Nausea and vomiting-2X today.  Sexually active X 1 partner since March.  Didn't think she could become pregnant because she has tried without success in the past.  Denies vaginal bleeding.  Is not sure where she will get care.  Asking for a list.  She was unaware she was pregnant until today.    Past Medical History  Diagnosis Date  . Vertigo   . Headache     Past Surgical History  Procedure Laterality Date  . No past surgeries      Family History  Problem Relation Age of Onset  . Diabetes Mother     History  Substance Use Topics  . Smoking status: Never Smoker   . Smokeless tobacco: Never Used  . Alcohol Use: No    Allergies: No Known Allergies  Prescriptions prior to admission  Medication Sig Dispense Refill  . acetaminophen (TYLENOL) 325 MG tablet Take 650 mg by mouth every 6 (six) hours as needed. Pain      . naproxen (NAPROSYN) 250 MG tablet Take 250 mg by mouth 2 (two) times daily with a meal. Pain      . nitrofurantoin, macrocrystal-monohydrate, (MACROBID) 100 MG capsule Take 1 capsule (100 mg total) by mouth 2 (two) times daily.  10 capsule  0  . ondansetron (ZOFRAN) 8 MG tablet Take 1 tablet (8 mg total) by mouth every 8 (eight) hours as needed for nausea.  12 tablet  0  . sulfamethoxazole-trimethoprim (BACTRIM DS) 800-160 MG per tablet Take 1 tablet by mouth 2 (two) times daily.        Review of Systems  Constitutional: Negative for fever and chills.  Gastrointestinal: Positive for heartburn (yesterday), nausea, vomiting and abdominal pain. Negative for diarrhea and constipation.  Genitourinary: Positive for frequency. Negative for dysuria, urgency and hematuria.       Neg for vaginal  bleeding.  + vaginal odor.   Neurological: Negative for headaches.   Physical Exam   Blood pressure 130/74, pulse 83, temperature 98.5 F (36.9 C), temperature source Oral, resp. rate 20, height 5\' 1"  (1.549 m), weight 270 lb (122.471 kg), last menstrual period 03/20/2012, SpO2 100.00%.  Physical Exam  Constitutional: She is oriented to person, place, and time. She appears well-developed and well-nourished. No distress.  HENT:  Head: Normocephalic.  Neck: Normal range of motion.  Cardiovascular: Normal rate.   Respiratory: Effort normal.  GI: Soft. She exhibits no distension and no mass. There is no tenderness. There is no rebound and no guarding.  Genitourinary: There is no rash, tenderness or lesion on the right labia. There is no rash, tenderness or lesion on the left labia. Uterus is enlarged (8 week size). Uterus is not tender. Cervix exhibits no motion tenderness, no discharge and no friability. Right adnexum displays no mass, no tenderness and no fullness. Left adnexum displays no mass, no tenderness and no fullness. No erythema, tenderness or bleeding around the vagina. Vaginal discharge: moderate amount of frothy yellow discharge with odor.  Neurological: She is alert and oriented to person, place, and time.  Skin: Skin is warm and dry.  Psychiatric: She has a normal mood and affect. Her behavior is  normal.    UA and CBC done at [facility] earlier today CBC wnl  Urine + for trichomonas  Results for orders placed during the hospital encounter of 05/17/12 (from the past 24 hour(s))  ABO/RH     Status: None   Collection Time    05/17/12  3:13 PM      Result Value Range   ABO/RH(D) B POS    HCG, QUANTITATIVE, PREGNANCY     Status: Abnormal   Collection Time    05/17/12  3:14 PM      Result Value Range   hCG, Beta Chain, [place] Longest 308657 (*) <5 mIU/mL  WET PREP, GENITAL     Status: Abnormal   Collection Time    05/17/12  6:25 PM      Result Value Range   Yeast Wet Prep HPF POC  FEW (*) NONE SEEN   Trich, Wet Prep FEW (*) NONE SEEN   Clue Cells Wet Prep HPF POC FEW (*) NONE SEEN   WBC, Wet Prep HPF POC FEW (*) NONE SEEN     MAU Course  Procedures  GC/CHL culture to lab  MDM  Assessment and Plan  A:  Nausea and vomiting in pregnancy     Viable IUP [redacted]w[redacted]d      Trichomonal infection  P:  Rx for Flagyl 500mg  po bid X 1 week and Rx for Phenergan 12.5mg  tabs po q6-8hrs prn nausea to pharmacy      Avoid intercourse until antibiotic is completed and partner has been treated.     Begin prenatal care with doctor of your choice    Matt Holmes 05/17/2012, 7:31 PM

## 2012-05-17 NOTE — MAU Note (Signed)
Patient states she was seen at [facility] ED today and had a positive pregnancy test. Has been having a lot of vomiting and nausea. Has been having abdominal pain for about one month.

## 2012-05-17 NOTE — ED Notes (Addendum)
Per EMS: pt is dealing with n/v x 1 month, states doctor cant find out what's going on. Got weak at work and called EMS.   Per pt:  Pt lifting boxes end of March and then began having abdominal pain.  Pt has had vomiting and some weight loss.  LMP- March 3rd, 2014.  Pt states that she vomited up blood this morning. Conveyed to Dr. Denton Lank.

## 2012-05-17 NOTE — MAU Note (Addendum)
Pt states she states she was having abdominal pain earlier today .Pt states she has pain when "I am doing too much"Pt states pain goes away on rest Pt states she has had pain since the beginning of March

## 2012-05-17 NOTE — ED Notes (Addendum)
Came in to round on pt and noticed that she was crying.  Pt stated that she is shocked to find out that she is pregnant as she has tried in the past without successful.  Pt states that she is feeling anxious and happy. RN sat with pt briefly and listened.

## 2012-05-17 NOTE — ED Provider Notes (Signed)
History     CSN: [id number]  Arrival date & time 05/17/12  4098   First MD Initiated Contact with Patient 05/17/12 [phone]      Chief Complaint  Patient presents with  . Nausea  . Emesis    (Consider location/radiation/quality/duration/timing/severity/associated sxs/prior treatment) Patient is a 32 y.o. female presenting with vomiting. The history is provided by the patient.  Emesis Associated symptoms: no abdominal pain, no chills, no diarrhea, no headaches and no sore throat   pt c/o nausea for the past month. Constant. Denies specific exacerbating or alleviating factors. Couple episodes emesis -not bloody or bilious, color of recently ingested fluids.  No abd distension. Having normal stools, stools normal color. lnmp mid March. No birth control. No abd or pelvic pain. No vaginal discharge or bleeding. No fever or chills. No wt loss or wt gain. No hx pud, gallstones, or prior abd surgery. No cp or sob. No cough or uri c/o. No dysuria or gu c/o.   Past Medical History  Diagnosis Date  . Vertigo   . Headache     History reviewed. No pertinent past surgical history.  No family history on file.  History  Substance Use Topics  . Smoking status: Never Smoker   . Smokeless tobacco: Never Used  . Alcohol Use: No    OB History   Grav Para Term Preterm Abortions TAB SAB Ect Mult Living                  Review of Systems  Constitutional: Negative for fever and chills.  HENT: Negative for sore throat and neck pain.   Eyes: Negative for redness and visual disturbance.  Respiratory: Negative for cough and shortness of breath.   Cardiovascular: Negative for chest pain.  Gastrointestinal: Positive for nausea and vomiting. Negative for abdominal pain, diarrhea and constipation.  Genitourinary: Negative for dysuria, flank pain, vaginal bleeding and vaginal discharge.  Musculoskeletal: Negative for back pain.  Skin: Negative for rash.  Neurological: Negative for headaches.   Hematological: Does not bruise/bleed easily.  Psychiatric/Behavioral: Negative for confusion.    Allergies  Review of patient's allergies indicates no known allergies.  Home Medications   Current Outpatient Rx  Name  Route  Sig  Dispense  Refill  . acetaminophen (TYLENOL) 325 MG tablet   Oral   Take 650 mg by mouth every 6 (six) hours as needed. Pain         . naproxen (NAPROSYN) 250 MG tablet   Oral   Take 250 mg by mouth 2 (two) times daily with a meal. Pain         . sulfamethoxazole-trimethoprim (BACTRIM DS) 800-160 MG per tablet   Oral   Take 1 tablet by mouth 2 (two) times daily.           BP 113/62  Pulse 79  Temp(Src) 98.4 F (36.9 C) (Oral)  Resp 18  SpO2 100%  LMP 03/20/2012  Physical Exam  Nursing note and vitals reviewed. Constitutional: She is oriented to person, place, and time. She appears well-developed and well-nourished. No distress.  HENT:  Head: Atraumatic.  Mouth/Throat: Oropharynx is clear and moist.  Eyes: Conjunctivae are normal. No scleral icterus.  Neck: Neck supple. No tracheal deviation present. No thyromegaly present.  No stiffness or rigidity  Cardiovascular: Normal rate, regular rhythm, normal heart sounds and intact distal pulses.   Pulmonary/Chest: Effort normal and breath sounds normal. No respiratory distress.  Abdominal: Soft. Normal appearance and bowel sounds are normal.  She exhibits no distension and no mass. There is no tenderness. There is no rebound and no guarding.  obese  Genitourinary:  No cva tenderness  Musculoskeletal: She exhibits no edema.  Neurological: She is alert and oriented to person, place, and time.  Skin: Skin is warm and dry. No rash noted. She is not diaphoretic.  Psychiatric: She has a normal mood and affect.    ED Course  Procedures (including critical care time)  Results for orders placed during the hospital encounter of 05/17/12  CBC WITH DIFFERENTIAL      Result Value Range   WBC 4.6   4.0 - 10.5 K/uL   RBC 4.77  3.87 - 5.11 MIL/uL   Hemoglobin 12.6  12.0 - 15.0 g/dL   HCT 16.1  09.6 - 04.5 %   MCV 77.4 (*) 78.0 - 100.0 fL   MCH 26.4  26.0 - 34.0 pg   MCHC 34.1  30.0 - 36.0 g/dL   RDW 40.9  81.1 - 91.4 %   Platelets    150 - 400 K/uL   Value: PLATELET CLUMPS NOTED ON SMEAR, COUNT APPEARS ADEQUATE   Neutrophils Relative 71  43 - 77 %   Lymphocytes Relative 23  12 - 46 %   Monocytes Relative 6  3 - 12 %   Eosinophils Relative 0  0 - 5 %   Basophils Relative 0  0 - 1 %   Neutro Abs 3.2  1.7 - 7.7 K/uL   Lymphs Abs 1.1  0.7 - 4.0 K/uL   Monocytes Absolute 0.3  0.1 - 1.0 K/uL   Eosinophils Absolute 0.0  0.0 - 0.7 K/uL   Basophils Absolute 0.0  0.0 - 0.1 K/uL   WBC Morphology VACUOLATED NEUTROPHILS    COMPREHENSIVE METABOLIC PANEL      Result Value Range   Sodium 133 (*) 135 - 145 mEq/L   Potassium 3.6  3.5 - 5.1 mEq/L   Chloride 99  96 - 112 mEq/L   CO2 21  19 - 32 mEq/L   Glucose, Bld 76  70 - 99 mg/dL   BUN 7  6 - 23 mg/dL   Creatinine, Ser 7.82  0.50 - 1.10 mg/dL   Calcium 9.4  8.4 - 95.6 mg/dL   Total Protein 7.6  6.0 - 8.3 g/dL   Albumin 3.8  3.5 - 5.2 g/dL   AST 18  0 - 37 U/L   ALT 12  0 - 35 U/L   Alkaline Phosphatase 40  39 - 117 U/L   Total Bilirubin 0.5  0.3 - 1.2 mg/dL   GFR calc non Af Amer >90  >90 mL/min   GFR calc Af Amer >90  >90 mL/min  LIPASE, BLOOD      Result Value Range   Lipase 41  11 - 59 U/L  URINALYSIS, MICROSCOPIC ONLY      Result Value Range   Color, Urine YELLOW  YELLOW   APPearance CLOUDY (*) CLEAR   Specific Gravity, Urine 1.014  1.005 - 1.030   pH 6.5  5.0 - 8.0   Glucose, UA NEGATIVE  NEGATIVE mg/dL   Hgb urine dipstick LARGE (*) NEGATIVE   Bilirubin Urine NEGATIVE  NEGATIVE   Ketones, ur 15 (*) NEGATIVE mg/dL   Protein, ur NEGATIVE  NEGATIVE mg/dL   Urobilinogen, UA 0.2  0.0 - 1.0 mg/dL   Nitrite NEGATIVE  NEGATIVE   Leukocytes, UA MODERATE (*) NEGATIVE   WBC, UA 21-50  <3 WBC/hpf  RBC / HPF 11-20  <3 RBC/hpf    Bacteria, UA FEW (*) RARE   Squamous Epithelial / LPF MANY (*) RARE   Urine-Other TRICHOMONAS PRESENT    POCT PREGNANCY, URINE      Result Value Range   Preg Test, Ur POSITIVE (*) NEGATIVE  OCCULT BLOOD, POC DEVICE      Result Value Range   Fecal Occult Bld NEGATIVE  NEGATIVE      MDM  zofran po. Labs.  Reviewed nursing notes and prior charts for additional history.   Recheck tolerating po fluids well.   No recurrent nv.   abd soft nt.  Discussed labs w pt incl pos u preg. Pt has no pelvic pain or tenderness. No vaginal bleeding or d/c.  Discussed need close gyn follow up.  Also discussed ua, pos 21-50 wbc, cx sent. Will rx macrobid.   Pt appears stable for d/c.         Suzi Roots, MD 05/17/12 1253

## 2012-05-17 NOTE — Progress Notes (Signed)
Pt states she rates this headache a 7

## 2012-05-17 NOTE — ED Notes (Signed)
MD at bedside. 

## 2012-05-17 NOTE — ED Notes (Signed)
[id number]<AV> Expected date:<BR> Expected time:<BR> Means of arrival:<BR> Comments:<BR> 31yo-nausea/vomiting

## 2012-05-18 LAB — URINE CULTURE
Colony Count: NO GROWTH
Culture: NO GROWTH

## 2012-05-18 LAB — GC/CHLAMYDIA PROBE AMP: GC Probe RNA: NEGATIVE

## 2012-05-18 NOTE — MAU Note (Signed)
Pt called in questioning treatment and partner treatment for trich.pt had picked up flagyl and started.  Verbalized understanding - NO sex until both have been treated for a wk.

## 2012-05-22 NOTE — MAU Provider Note (Signed)
Attestation of Attending Supervision of Advanced Practitioner (CNM/NP): Evaluation and management procedures were performed by the Advanced Practitioner under my supervision and collaboration. I have reviewed the Advanced Practitioner's note and chart, and I agree with the management and plan.  Briannia Laba H. 3:31 PM   

## 2012-05-26 ENCOUNTER — Inpatient Hospital Stay (HOSPITAL_COMMUNITY)
Admission: AD | Admit: 2012-05-26 | Discharge: 2012-05-26 | Disposition: A | Discharge status: Home / Self Care | Payer: Medicaid Other | Source: Ambulatory Visit | Attending: Obstetrics & Gynecology | Admitting: Obstetrics & Gynecology

## 2012-05-26 ENCOUNTER — Encounter (HOSPITAL_COMMUNITY): Payer: Self-pay | Admitting: Family

## 2012-05-26 DIAGNOSIS — B3731 Acute candidiasis of vulva and vagina: Secondary | ICD-10-CM | POA: Insufficient documentation

## 2012-05-26 DIAGNOSIS — R109 Unspecified abdominal pain: Secondary | ICD-10-CM | POA: Insufficient documentation

## 2012-05-26 DIAGNOSIS — M549 Dorsalgia, unspecified: Secondary | ICD-10-CM

## 2012-05-26 DIAGNOSIS — B373 Candidiasis of vulva and vagina: Secondary | ICD-10-CM

## 2012-05-26 DIAGNOSIS — O2390 Unspecified genitourinary tract infection in pregnancy, unspecified trimester: Secondary | ICD-10-CM | POA: Insufficient documentation

## 2012-05-26 DIAGNOSIS — O210 Mild hyperemesis gravidarum: Secondary | ICD-10-CM | POA: Insufficient documentation

## 2012-05-26 HISTORY — DX: Trichomoniasis, unspecified: A59.9

## 2012-05-26 HISTORY — DX: Acute vaginitis: N76.0

## 2012-05-26 HISTORY — DX: Other specified bacterial agents as the cause of diseases classified elsewhere: B96.89

## 2012-05-26 HISTORY — DX: Urinary tract infection, site not specified: N39.0

## 2012-05-26 LAB — URINALYSIS, ROUTINE W REFLEX MICROSCOPIC
Glucose, UA: NEGATIVE mg/dL
Leukocytes, UA: NEGATIVE
Nitrite: NEGATIVE
pH: 5.5 (ref 5.0–8.0)

## 2012-05-26 LAB — WET PREP, GENITAL: Clue Cells Wet Prep HPF POC: NONE SEEN

## 2012-05-26 MED ORDER — FLUCONAZOLE 150 MG PO TABS
150.0000 mg | ORAL_TABLET | Freq: Once | ORAL | Status: AC
  Administered 2012-05-26: 150 mg via ORAL
  Filled 2012-05-26: qty 1

## 2012-05-26 MED ORDER — TERCONAZOLE 0.4 % VA CREA: 1.0000 | TOPICAL_CREAM | Freq: Every day | VAGINAL | Status: DC

## 2012-05-26 NOTE — MAU Provider Note (Signed)
Attestation of Attending Supervision of Advanced Practitioner (CNM/NP): Evaluation and management procedures were performed by the Advanced Practitioner under my supervision and collaboration. I have reviewed the Advanced Practitioner's note and chart, and I agree with the management and plan.  Priscilla Alexie H. 6:20 PM

## 2012-05-26 NOTE — MAU Note (Addendum)
Patient presents to MAU with c/o intermittent L lower abdominal pain since Monday; reports her job requires a lot of bending and lifting, and she has been limiting lifting weight to 5 lbs.  Denies wearing back brace or supportive devices at work. Denies vaginal bleeding, LOF or cramping. Reports thick white vaginal discharge with vaginal itching.  Reports taking Tylenol 500mg  at 1230 today.   Reports she was prescribed Flagyl and Macrobid on 4/29; completed Flagyl but did not fill Rx for Macrobid because she thought they were the same treatment.

## 2012-05-26 NOTE — MAU Provider Note (Signed)
History     CSN: [id number]  Arrival date and time: 05/26/12 1546   First Provider Initiated Contact with Patient 05/26/12 1636      Chief Complaint  Patient presents with  . Abdominal Pain   HPI Priscilla Powell is 32 y.o. G1P0 [redacted]w[redacted]d weeks presenting with lower back,   She was seen here on 4/30 with same sxs and nausea and vomiting.  She took Phenergan this am for nausea and it has helped.  She completed medication for Trichomonas Dx at 4/30 visit and states her partner was treated as well.   Now has vaginal discharge with itching.   Denies vaginal bleeding, UTI sxs.   Has URI sxs.  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  Plans care with Dr. Gaynell Face when Medicaid approved.    Past Medical History  Diagnosis Date  . Vertigo   . Headache   . BV (bacterial vaginosis)   . UTI (lower urinary tract infection)   . Trichimoniasis     Past Surgical History  Procedure Laterality Date  . No past surgeries    . Cryoablation      Family History  Problem Relation Age of Onset  . Diabetes Mother     History  Substance Use Topics  . Smoking status: Never Smoker   . Smokeless tobacco: Never Used  . Alcohol Use: No    Allergies: No Known Allergies  Prescriptions prior to admission  Medication Sig Dispense Refill  . acetaminophen (TYLENOL) 325 MG tablet Take 650 mg by mouth every 6 (six) hours as needed. Pain      . ondansetron (ZOFRAN) 8 MG tablet Take 1 tablet (8 mg total) by mouth every 8 (eight) hours as needed for nausea.  12 tablet  0  . Prenatal Vit-Fe Fumarate-FA (PRENATAL MULTIVITAMIN) TABS Take 1 tablet by mouth daily at 12 noon.        Review of Systems  Constitutional: Negative for fever and chills.  Gastrointestinal: Positive for nausea and abdominal pain (mid-upper that occurs with lifting). Negative for vomiting.  Genitourinary: Negative for dysuria, urgency, frequency and hematuria.       Neg for vaginal bleeding.  Positive for  increase discharge that itches  Neurological: Negative for headaches.   Physical Exam   Blood pressure 126/84, pulse 96, temperature 98.7 F (37.1 C), temperature source Oral, resp. rate 18, height 5\' 1"  (1.549 m), weight 270 lb (122.471 kg), last menstrual period 03/20/2012.  Physical Exam  Constitutional: She is oriented to person, place, and time. She appears well-developed and well-nourished. No distress.  HENT:  Head: Normocephalic.  Neck: Normal range of motion.  Cardiovascular: Normal rate.   Respiratory: Effort normal.  GI: Soft. She exhibits no distension and no mass. There is no tenderness (area of concern is not tender on exam). There is no rebound and no guarding.  Genitourinary: There is no rash, tenderness or lesion on the right labia. There is no rash, tenderness or lesion on the left labia. Uterus is enlarged (measures 9-10 week size). Uterus is not tender. Cervix exhibits no motion tenderness, no discharge and no friability. Right adnexum displays no mass, no tenderness and no fullness. Left adnexum displays no mass, no tenderness and no fullness. No erythema, tenderness or bleeding around the vagina. Vaginal discharge (moderate amount of white clumpy discharge without odor) found.  Neurological: She is alert and oriented to person, place, and time.  Skin: Skin is warm and  dry.  Psychiatric: She has a normal mood and affect. Her behavior is normal.   Results for orders placed during the hospital encounter of 05/26/12 (from the past 24 hour(s))  URINALYSIS, ROUTINE W REFLEX MICROSCOPIC     Status: Abnormal   Collection Time    05/26/12  3:55 PM      Result Value Range   Color, Urine YELLOW  YELLOW   APPearance CLOUDY (*) CLEAR   Specific Gravity, Urine >1.030 (*) 1.005 - 1.030   pH 5.5  5.0 - 8.0   Glucose, UA NEGATIVE  NEGATIVE mg/dL   Hgb urine dipstick NEGATIVE  NEGATIVE   Bilirubin Urine NEGATIVE  NEGATIVE   Ketones, ur 40 (*) NEGATIVE mg/dL   Protein, ur  NEGATIVE  NEGATIVE mg/dL   Urobilinogen, UA 0.2  0.0 - 1.0 mg/dL   Nitrite NEGATIVE  NEGATIVE   Leukocytes, UA NEGATIVE  NEGATIVE  WET PREP, GENITAL     Status: Abnormal   Collection Time    05/26/12  4:47 PM      Result Value Range   Yeast Wet Prep HPF POC MODERATE (*) NONE SEEN   Trich, Wet Prep NONE SEEN  NONE SEEN   Clue Cells Wet Prep HPF POC NONE SEEN  NONE SEEN   WBC, Wet Prep HPF POC MANY (*) NONE SEEN     MAU Course  Procedures  GC/CHL culture done here on 4/30 was Neg for both GC/CHL  MDM  Assessment and Plan  A:  Yeast vaginitis in early pregnancy       Resolved trichomonal infection  P: Diflucan 150mg  po given in MAU     Terazol 7 vaginal creme 1 applicator in the vagina at HS X 7     Continue Phenergan as needed     Patient instructed to insert applicator in half the distance      Begin prenatal care with Dr. Gaynell Face when her insurance in effect     Note for work today  Slate Debroux,EVE M 05/26/2012, 5:49 PM

## 2012-06-16 ENCOUNTER — Encounter (HOSPITAL_COMMUNITY): Payer: Self-pay | Admitting: Family

## 2012-06-16 ENCOUNTER — Inpatient Hospital Stay (HOSPITAL_COMMUNITY)
Admission: AD | Admit: 2012-06-16 | Discharge: 2012-06-16 | Disposition: A | Discharge status: Home / Self Care | Payer: Medicaid Other | Source: Ambulatory Visit | Attending: Obstetrics and Gynecology | Admitting: Obstetrics and Gynecology

## 2012-06-16 DIAGNOSIS — K047 Periapical abscess without sinus: Secondary | ICD-10-CM | POA: Insufficient documentation

## 2012-06-16 DIAGNOSIS — K089 Disorder of teeth and supporting structures, unspecified: Secondary | ICD-10-CM | POA: Insufficient documentation

## 2012-06-16 DIAGNOSIS — O99891 Other specified diseases and conditions complicating pregnancy: Secondary | ICD-10-CM | POA: Insufficient documentation

## 2012-06-16 MED ORDER — ONDANSETRON HCL 8 MG PO TABS: 8.0000 mg | ORAL_TABLET | Freq: Three times a day (TID) | ORAL | Status: DC | PRN

## 2012-06-16 MED ORDER — CLINDAMYCIN HCL 150 MG PO CAPS: 150.0000 mg | ORAL_CAPSULE | Freq: Four times a day (QID) | ORAL | Status: DC

## 2012-06-16 MED ORDER — OXYCODONE-ACETAMINOPHEN 5-325 MG PO TABS: 1.0000 | ORAL_TABLET | ORAL | Status: DC | PRN

## 2012-06-16 MED ORDER — ONDANSETRON 8 MG PO TBDP
8.0000 mg | ORAL_TABLET | Freq: Once | ORAL | Status: AC
  Administered 2012-06-16: 8 mg via ORAL
  Filled 2012-06-16: qty 1

## 2012-06-16 MED ORDER — OXYCODONE-ACETAMINOPHEN 5-325 MG PO TABS
2.0000 | ORAL_TABLET | Freq: Once | ORAL | Status: AC
  Administered 2012-06-16: 2 via ORAL
  Filled 2012-06-16: qty 2

## 2012-06-16 NOTE — MAU Provider Note (Signed)
History     CSN: [id number]  Arrival date and time: 06/16/12 2126   First Provider Initiated Contact with Patient 06/16/12 2150      Chief Complaint  Patient presents with  . Dental Pain   HPI Ms. Priscilla Powell is a 32 y.o. G1P0 at [redacted]w[redacted]d who presents to MAU today with dental pain. The patient has a history of tooth abscess about 1 year ago. She has not been able to see a dentist as she is waiting for Medicaid. The patient states dental pain x 2 days. The pain has made it difficult to eat and causes nausea. She rates her pain at 10/10 now. She denies fever, abdominal pain, vaginal bleeding, discharge, LOF or UTI symptoms. The patient plans to follow-up with a dentist and Dr. Gaynell Face when Medicaid is active.   OB History   Grav Para Term Preterm Abortions TAB SAB Ect Mult Living   1               Past Medical History  Diagnosis Date  . Vertigo   . Headache(784.0)   . BV (bacterial vaginosis)   . UTI (lower urinary tract infection)   . Trichimoniasis     Past Surgical History  Procedure Laterality Date  . No past surgeries    . Cryoablation      Family History  Problem Relation Age of Onset  . Diabetes Mother     History  Substance Use Topics  . Smoking status: Never Smoker   . Smokeless tobacco: Never Used  . Alcohol Use: No    Allergies: No Known Allergies  Prescriptions prior to admission  Medication Sig Dispense Refill  . acetaminophen (TYLENOL) 325 MG tablet Take 650 mg by mouth every 6 (six) hours as needed. Pain      . ondansetron (ZOFRAN) 8 MG tablet Take 1 tablet (8 mg total) by mouth every 8 (eight) hours as needed for nausea.  12 tablet  0  . Prenatal Vit-Fe Fumarate-FA (PRENATAL MULTIVITAMIN) TABS Take 1 tablet by mouth daily at 12 noon.      Marland Kitchen terconazole (TERAZOL 7) 0.4 % vaginal cream Place 1 applicator vaginally at bedtime.  45 g  0    Review of Systems  Constitutional: Negative for fever.  Gastrointestinal: Negative for abdominal pain.    Genitourinary: Negative for dysuria, urgency and frequency.       Neg - vaginal bleeding, discharge, LOF   Physical Exam   Blood pressure 130/74, pulse 95, temperature 98.3 F (36.8 C), temperature source Oral, resp. rate 20, height 5\' 1"  (1.549 m), weight 261 lb 9.6 oz (118.661 kg), last menstrual period 03/20/2012, SpO2 100.00%.  Physical Exam  Constitutional: She is oriented to person, place, and time. She appears well-developed and well-nourished. No distress.  HENT:  Head: Normocephalic and atraumatic.  Mouth/Throat: Abnormal dentition. Dental abscesses and dental caries present.    Cardiovascular: Normal rate, regular rhythm and normal heart sounds.   Respiratory: Effort normal and breath sounds normal. No respiratory distress.  GI: Soft. She exhibits no distension and no mass. There is no tenderness. There is no rebound and no guarding.  Neurological: She is alert and oriented to person, place, and time.  Skin: Skin is warm and dry. No erythema.  Psychiatric: She has a normal mood and affect.    MAU Course  Procedures None  MDM Percocet and Zofran ODT give in MAU Patient has information for Medicaid dentist. Will make appointment when medicaid is active.  Assessment and Plan  A: Dental Abscess  P: Discharge home Rx for Percocet, Zofran and Clindamycin given to patient Patient advised to follow-up for dental issues with a dentist as soon as possible Patient advised to start prenatal care with Dr. Gaynell Face as planned Patient may return to MAU as needed or if her condition were to change or worsen  Freddi Starr, PA-C  06/16/2012, 9:50 PM

## 2012-06-16 NOTE — MAU Note (Addendum)
Patient presents to MAU with c/o constant throbbing on far L upper tooth. Reports temperature sensitivity at the site. Reports clear drainage from tooth noted today.  Denies vaginal bleeding, LOF, cramping.

## 2012-06-16 NOTE — MAU Note (Signed)
Back, upper L tooth hurting since yesterday. Worse today and making jaw lock up.

## 2012-06-21 ENCOUNTER — Encounter: Payer: Self-pay | Admitting: Obstetrics

## 2012-07-12 ENCOUNTER — Inpatient Hospital Stay (HOSPITAL_COMMUNITY)
Admission: AD | Admit: 2012-07-12 | Discharge: 2012-07-13 | Disposition: A | Discharge status: Home / Self Care | Payer: Medicaid Other | Source: Ambulatory Visit | Attending: Obstetrics & Gynecology | Admitting: Obstetrics & Gynecology

## 2012-07-12 DIAGNOSIS — B9689 Other specified bacterial agents as the cause of diseases classified elsewhere: Secondary | ICD-10-CM | POA: Insufficient documentation

## 2012-07-12 DIAGNOSIS — M545 Low back pain, unspecified: Secondary | ICD-10-CM | POA: Insufficient documentation

## 2012-07-12 DIAGNOSIS — N949 Unspecified condition associated with female genital organs and menstrual cycle: Secondary | ICD-10-CM | POA: Insufficient documentation

## 2012-07-12 DIAGNOSIS — N760 Acute vaginitis: Secondary | ICD-10-CM | POA: Insufficient documentation

## 2012-07-12 DIAGNOSIS — A499 Bacterial infection, unspecified: Secondary | ICD-10-CM | POA: Insufficient documentation

## 2012-07-12 DIAGNOSIS — O26899 Other specified pregnancy related conditions, unspecified trimester: Secondary | ICD-10-CM

## 2012-07-12 DIAGNOSIS — B373 Candidiasis of vulva and vagina: Secondary | ICD-10-CM | POA: Insufficient documentation

## 2012-07-12 DIAGNOSIS — B3731 Acute candidiasis of vulva and vagina: Secondary | ICD-10-CM | POA: Insufficient documentation

## 2012-07-12 DIAGNOSIS — O2390 Unspecified genitourinary tract infection in pregnancy, unspecified trimester: Secondary | ICD-10-CM | POA: Insufficient documentation

## 2012-07-12 HISTORY — DX: Unspecified otitis externa, bilateral: H60.93

## 2012-07-12 LAB — URINALYSIS, ROUTINE W REFLEX MICROSCOPIC
Bilirubin Urine: NEGATIVE
Ketones, ur: 15 mg/dL — AB
Nitrite: NEGATIVE
Urobilinogen, UA: 0.2 mg/dL (ref 0.0–1.0)
pH: 6 (ref 5.0–8.0)

## 2012-07-12 LAB — URINE MICROSCOPIC-ADD ON

## 2012-07-12 NOTE — MAU Note (Signed)
Pt reports she started having back pain earlier today took some tylenol and it helped but pain is back and it is much worse and now pain in vaginal area as well. Denies andy vaginal bleeding.

## 2012-07-13 ENCOUNTER — Encounter (HOSPITAL_COMMUNITY): Payer: Self-pay

## 2012-07-13 DIAGNOSIS — O9989 Other specified diseases and conditions complicating pregnancy, childbirth and the puerperium: Secondary | ICD-10-CM

## 2012-07-13 DIAGNOSIS — N949 Unspecified condition associated with female genital organs and menstrual cycle: Secondary | ICD-10-CM

## 2012-07-13 LAB — WET PREP, GENITAL

## 2012-07-13 MED ORDER — IBUPROFEN 600 MG PO TABS
600.0000 mg | ORAL_TABLET | Freq: Once | ORAL | Status: AC
  Administered 2012-07-13: 600 mg via ORAL
  Filled 2012-07-13: qty 1

## 2012-07-13 MED ORDER — TERCONAZOLE 0.4 % VA CREA: 1.0000 | TOPICAL_CREAM | Freq: Every day | VAGINAL | Status: DC

## 2012-07-13 MED ORDER — METRONIDAZOLE 500 MG PO TABS: 500.0000 mg | ORAL_TABLET | Freq: Two times a day (BID) | ORAL | Status: DC

## 2012-07-13 NOTE — MAU Provider Note (Signed)
Chief Complaint: Back Pain  First Provider Initiated Contact with Patient 07/13/12 0353    SUBJECTIVE HPI: Priscilla Powell is a 32 y.o. G1P0 at [redacted]w[redacted]d by LMP who presents with worsening bilat groin pain and low back pain over the past few days. Describes pain as sharp, stabbing, intermittent. LBP is dull and constant. Also reports vaginal discharge. Pain decreases w/ rest. 4/10 at worst. Works in a nursing home. Denies fever, chills, flank pain, vaginal bleeding, urinary complaints except for external burning on occasion, GI complaints.   Past Medical History  Diagnosis Date  . Vertigo   . Headache(784.0)   . BV (bacterial vaginosis)   . UTI (lower urinary tract infection)   . Trichimoniasis   . Bilateral external ear infections    OB History   Grav Para Term Preterm Abortions TAB SAB Ect Mult Living   1              # Outc Date GA Lbr Len/2nd Wgt Sex Del Anes PTL Lv   1 CUR              Past Surgical History  Procedure Laterality Date  . No past surgeries    . Cryoablation     History   Social History  . Marital Status: Single    Spouse Name: N/A    Number of Children: N/A  . Years of Education: N/A   Occupational History  . Not on file.   Social History Main Topics  . Smoking status: Never Smoker   . Smokeless tobacco: Never Used  . Alcohol Use: No  . Drug Use: No  . Sexually Active: Yes    Birth Control/ Protection: None   Other Topics Concern  . Not on file   Social History Narrative  . No narrative on file   No current facility-administered medications on file prior to encounter.   Current Outpatient Prescriptions on File Prior to Encounter  Medication Sig Dispense Refill  . acetaminophen (TYLENOL) 325 MG tablet Take 650 mg by mouth every 6 (six) hours as needed. Pain      . oxyCODONE-acetaminophen (PERCOCET/ROXICET) 5-325 MG per tablet Take 1-2 tablets by mouth every 4 (four) hours as needed for pain.  20 tablet  0  . Prenatal Vit-Fe Fumarate-FA (PRENATAL  MULTIVITAMIN) TABS Take 1 tablet by mouth daily at 12 noon.      Marland Kitchen terconazole (TERAZOL 7) 0.4 % vaginal cream Place 1 applicator vaginally at bedtime.  45 g  0   No Known Allergies  ROS: Pertinent items in HPI  OBJECTIVE Blood pressure 118/85, pulse 107, temperature 98.3 F (36.8 C), temperature source Oral, resp. rate 18, height 5' (1.524 m), weight 120.022 kg (264 lb 9.6 oz), last menstrual period 03/20/2012. GENERAL: Well-developed, well-nourished female in no acute distress.  HEENT: Normocephalic HEART: normal rate RESP: normal effort ABDOMEN: Soft, obese, Gravid S=D. Mild TTP in bilat groin, L>R.  EXTREMITIES: Nontender, no edema NEURO: Alert and oriented SPECULUM EXAM: NEFG, large amount of thick, white, malodorous discharge, no blood noted, cervix clean BIMANUAL: cervix closed; uterus 17 week size. No adnexal tenderness or masses  LAB RESULTS Results for orders placed during the hospital encounter of 07/12/12 (from the past 24 hour(s))  URINALYSIS, ROUTINE W REFLEX MICROSCOPIC     Status: Abnormal   Collection Time    07/12/12  9:43 PM      Result Value Range   Color, Urine YELLOW  YELLOW   APPearance CLEAR  CLEAR  Specific Gravity, Urine >1.030 (*) 1.005 - 1.030   pH 6.0  5.0 - 8.0   Glucose, UA NEGATIVE  NEGATIVE mg/dL   Hgb urine dipstick NEGATIVE  NEGATIVE   Bilirubin Urine NEGATIVE  NEGATIVE   Ketones, ur 15 (*) NEGATIVE mg/dL   Protein, ur NEGATIVE  NEGATIVE mg/dL   Urobilinogen, UA 0.2  0.0 - 1.0 mg/dL   Nitrite NEGATIVE  NEGATIVE   Leukocytes, UA SMALL (*) NEGATIVE  URINE MICROSCOPIC-ADD ON     Status: Abnormal   Collection Time    07/12/12  9:43 PM      Result Value Range   Squamous Epithelial / LPF RARE  RARE   WBC, UA 3-6  <3 WBC/hpf   RBC / HPF 3-6  <3 RBC/hpf   Bacteria, UA FEW (*) RARE  WET PREP, GENITAL     Status: Abnormal   Collection Time    07/13/12  3:29 AM      Result Value Range   Yeast Wet Prep HPF POC FEW (*) NONE SEEN   Trich, Wet  Prep NONE SEEN  NONE SEEN   Clue Cells Wet Prep HPF POC FEW (*) NONE SEEN   WBC, Wet Prep HPF POC FEW (*) NONE SEEN     IMAGING No results found.  MAU COURSE  ASSESSMENT 1. Pain of round ligament complicating pregnancy, antepartum   2. Vulvovaginal candidiasis   3. BV (bacterial vaginosis)     PLAN Discharge home     Follow-up Information   Follow up with [facility] On 07/14/2012. (as scheduled)    Contact information:   [address] [place] [address] [phone]      Follow up with [facility] [place] MATERNITY ADMISSIONS. (As needed in emergencies)    Contact information:   [address] [id number] [place] [address] [phone]       Medication List    STOP taking these medications       clindamycin 150 MG capsule  Commonly known as:  CLEOCIN     ondansetron 8 MG tablet  Commonly known as:  ZOFRAN      TAKE these medications       acetaminophen 325 MG tablet  Commonly known as:  TYLENOL  Take 650 mg by mouth every 6 (six) hours as needed. Pain     metroNIDAZOLE 500 MG tablet  Commonly known as:  FLAGYL  Take 1 tablet (500 mg total) by mouth 2 (two) times daily.     miconazole Kit  Commonly known as:  MONISTAT-3  Place vaginally at bedtime.     oxyCODONE-acetaminophen 5-325 MG per tablet  Commonly known as:  PERCOCET/ROXICET  Take 1-2 tablets by mouth every 4 (four) hours as needed for pain.     prenatal multivitamin Tabs  Take 1 tablet by mouth daily at 12 noon.     terconazole 0.4 % vaginal cream  Commonly known as:  TERAZOL 7  Place 1 applicator vaginally at bedtime.     terconazole 0.4 % vaginal cream  Commonly known as:  TERAZOL 7  Place 1 applicator vaginally at bedtime.         [place], CNM 07/13/2012  4:03 AM

## 2012-07-14 LAB — URINE CULTURE: Colony Count: 80000

## 2012-08-01 LAB — OB RESULTS CONSOLE HEPATITIS B SURFACE ANTIGEN: Hepatitis B Surface Ag: NEGATIVE

## 2012-08-01 LAB — OB RESULTS CONSOLE HIV ANTIBODY (ROUTINE TESTING): HIV: NONREACTIVE

## 2012-08-01 LAB — OB RESULTS CONSOLE ABO/RH: RH Type: POSITIVE

## 2012-08-01 LAB — OB RESULTS CONSOLE RUBELLA ANTIBODY, IGM: Rubella: IMMUNE

## 2012-08-01 LAB — OB RESULTS CONSOLE RPR: RPR: NONREACTIVE

## 2012-08-01 LAB — OB RESULTS CONSOLE GC/CHLAMYDIA: Chlamydia: NEGATIVE

## 2012-08-18 ENCOUNTER — Inpatient Hospital Stay (HOSPITAL_COMMUNITY)
Admission: AD | Admit: 2012-08-18 | Discharge: 2012-08-18 | Disposition: A | Discharge status: Home / Self Care | Payer: Medicaid Other | Source: Ambulatory Visit | Attending: Obstetrics and Gynecology | Admitting: Obstetrics and Gynecology

## 2012-08-18 ENCOUNTER — Encounter (HOSPITAL_COMMUNITY): Payer: Self-pay | Admitting: *Deleted

## 2012-08-18 ENCOUNTER — Inpatient Hospital Stay (HOSPITAL_COMMUNITY): Payer: Medicaid Other

## 2012-08-18 DIAGNOSIS — O99891 Other specified diseases and conditions complicating pregnancy: Secondary | ICD-10-CM | POA: Insufficient documentation

## 2012-08-18 DIAGNOSIS — O26872 Cervical shortening, second trimester: Secondary | ICD-10-CM

## 2012-08-18 DIAGNOSIS — Y9210 Unspecified residential institution as the place of occurrence of the external cause: Secondary | ICD-10-CM | POA: Insufficient documentation

## 2012-08-18 DIAGNOSIS — R109 Unspecified abdominal pain: Secondary | ICD-10-CM

## 2012-08-18 DIAGNOSIS — O9A212 Injury, poisoning and certain other consequences of external causes complicating pregnancy, second trimester: Secondary | ICD-10-CM

## 2012-08-18 DIAGNOSIS — O26879 Cervical shortening, unspecified trimester: Secondary | ICD-10-CM

## 2012-08-18 DIAGNOSIS — IMO0002 Reserved for concepts with insufficient information to code with codable children: Secondary | ICD-10-CM | POA: Insufficient documentation

## 2012-08-18 MED ORDER — PROGESTERONE 200 MG VA SUPP: 200.0000 mg | Freq: Every day | VAGINAL | Status: DC

## 2012-08-18 MED ORDER — PROGESTERONE MICRONIZED 200 MG PO CAPS
200.0000 mg | ORAL_CAPSULE | ORAL | Status: AC
  Administered 2012-08-18: 200 mg via VAGINAL
  Filled 2012-08-18: qty 1
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
## 2012-08-18 NOTE — MAU Provider Note (Signed)
Chief Complaint:  No chief complaint on file.   First Provider Initiated Contact with Patient 08/18/12 2025      HPI: Priscilla Powell is a 32 y.o. G1P0 at 53w4dwho presents to maternity admissions reporting abdominal trauma today.  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  Afterwards, she had some intermittent abdominal pain and vomiting x1 episode.  Since arrival in MAU, she has felt fetal movement, and the pain has decreased and is almost gone.  She denies LOF, vaginal bleeding, vaginal itching/burning, urinary symptoms, h/a, dizziness, or fever/chills.     Past Medical History: Past Medical History  Diagnosis Date  . Vertigo   . Headache(784.0)   . BV (bacterial vaginosis)   . UTI (lower urinary tract infection)   . Trichimoniasis   . Bilateral external ear infections     Past obstetric history: OB History   Grav Para Term Preterm Abortions TAB SAB Ect Mult Living   1              # Outc Date GA Lbr Len/2nd Wgt Sex Del Anes PTL Lv   1 CUR               Past Surgical History: Past Surgical History  Procedure Laterality Date  . No past surgeries    . Cryoablation      Family History: Family History  Problem Relation Age of Onset  . Diabetes Mother     Social History: History  Substance Use Topics  . Smoking status: Never Smoker   . Smokeless tobacco: Never Used  . Alcohol Use: No    Allergies: No Known Allergies  Meds:  Prescriptions prior to admission  Medication Sig Dispense Refill  . Prenatal Vit-Fe Fumarate-FA (PRENATAL MULTIVITAMIN) TABS Take 1 tablet by mouth at bedtime.      Marland Kitchen terconazole (TERAZOL 7) 0.4 % vaginal cream Place 1 applicator vaginally daily as needed (for infection).      . [DISCONTINUED] terconazole (TERAZOL 7) 0.4 % vaginal cream Place 1 applicator vaginally at bedtime.  45 g  0    ROS: Pertinent findings in history of  present illness.  Physical Exam  Blood pressure 126/72, pulse 89, temperature 97.7 F (36.5 C), temperature source Oral, resp. rate 20, height 5' (1.524 m), weight 122.471 kg (270 lb), last menstrual period 03/20/2012. GENERAL: Well-developed, well-nourished female in no acute distress.  HEENT: normocephalic HEART: normal rate RESP: normal effort ABDOMEN: Soft, non-tender, gravid appropriate for gestational age EXTREMITIES: Nontender, no edema NEURO: alert and oriented    FHT: 155 with moderate variability on EFM Toco with no contractions, and none by palpation   Imaging:     Assessment: 1. Traumatic injury during pregnancy in second trimester   2. Short cervix in second trimester, antepartum     Plan: Discussed assessment and findings with Dr Ellyn Hack Discharge home Progesterone 200 mg suppository Q PM (First dose given in MAU r/t late hour, pharmacy hours) PTL precautions, pelvic rest, letter given to be out of work F/U in office Mon or Tues for U/S Return to MAU as needed    Sharen Counter Certified Nurse-Midwife 08/18/2012 8:35 PM

## 2012-08-23 MED ORDER — SODIUM CHLORIDE 0.9 % IV SOLN
3.0000 g | Freq: Once | INTRAVENOUS | Status: AC
  Administered 2012-08-24: 3 g via INTRAVENOUS
  Filled 2012-08-23: qty 3

## 2012-08-23 MED ORDER — SODIUM CHLORIDE 0.9 % IV SOLN
3.0000 g | Freq: Once | INTRAVENOUS | Status: DC
  Filled 2012-08-23: qty 3

## 2012-08-23 NOTE — H&P (Signed)
Priscilla Powell is a 32 y.o. female G1P0 at 22+ weeks (EDD 12/25/12 by 8 week [place]) presenting for cervical cerclage placement given a finding of cervical shortening and dynamic change which has progressively worsened.  Was seen after a fall at work and [place] 8/1 showed a cervix that was about 1.5 cm.  4 days later an US shows the cervix at .75 cm and pelvic exam agrees with cervix appearing closed but shortened.   History OB History   Grav Para Term Preterm Abortions TAB SAB Ect Mult Living   1              Past Medical History  Diagnosis Date  . Vertigo   . Headache(784.0)   . BV (bacterial vaginosis)   . UTI (lower urinary tract infection)   . Trichimoniasis   . Bilateral external ear infections    Past Surgical History  Procedure Laterality Date  . No past surgeries    . Cryoablation     Family History: family history includes Diabetes in her mother. Social History:  reports that she has never smoked. She has never used smokeless tobacco. She reports that she does not drink alcohol or use illicit drugs.   Prenatal Transfer Tool    ROS    Last menstrual period 03/20/2012. Exam Physical Exam  Constitutional: She appears well-developed and well-nourished.  Cardiovascular: Normal rate and regular rhythm.   Respiratory: Effort normal and breath sounds normal.  GI: Soft.  Genitourinary: Vagina normal.  Uterus gravid  Neurological: She is alert.  Psychiatric: She has a normal mood and affect.    Prenatal labs: ABO, Rh: --/--/B POS (04/30 1513) Antibody:  negative Rubella:  immune RPR:   NR HBsAg:   NR HIV:   NR GBS:   unknown  Assessment/Plan: Long d/w pt re: her history and concerns for incompetent cervix. We reviewed her options including expectant management vs cervical cerclage. We discussed the risks of cerclage including possible ROM and fetal loss. SHe understands and is agreeable to proceeding.   Oliver Pila 08/23/2012, 10:50 PM

## 2012-08-24 ENCOUNTER — Encounter (HOSPITAL_COMMUNITY): Payer: Self-pay | Admitting: Anesthesiology

## 2012-08-24 ENCOUNTER — Encounter (HOSPITAL_COMMUNITY)
Admission: RE | Disposition: A | Discharge status: Home / Self Care | Payer: Self-pay | Source: Ambulatory Visit | Attending: Obstetrics and Gynecology

## 2012-08-24 ENCOUNTER — Ambulatory Visit (HOSPITAL_COMMUNITY)
Admission: RE | Admit: 2012-08-24 | Discharge: 2012-08-24 | Disposition: A | Discharge status: Home / Self Care | Payer: Medicaid Other | Source: Ambulatory Visit | Attending: Obstetrics and Gynecology | Admitting: Obstetrics and Gynecology

## 2012-08-24 ENCOUNTER — Encounter (HOSPITAL_COMMUNITY): Payer: Self-pay | Admitting: *Deleted

## 2012-08-24 ENCOUNTER — Ambulatory Visit (HOSPITAL_COMMUNITY): Payer: Medicaid Other | Admitting: Anesthesiology

## 2012-08-24 DIAGNOSIS — O3430 Maternal care for cervical incompetence, unspecified trimester: Secondary | ICD-10-CM | POA: Insufficient documentation

## 2012-08-24 DIAGNOSIS — N883 Incompetence of cervix uteri: Secondary | ICD-10-CM

## 2012-08-24 DIAGNOSIS — O26879 Cervical shortening, unspecified trimester: Secondary | ICD-10-CM | POA: Insufficient documentation

## 2012-08-24 HISTORY — PX: CERVICAL CERCLAGE: SHX1329

## 2012-08-24 LAB — SURGICAL PCR SCREEN: MRSA, PCR: NEGATIVE

## 2012-08-24 LAB — CBC
MCH: 25.5 pg — ABNORMAL LOW (ref 26.0–34.0)
MCHC: 33.2 g/dL (ref 30.0–36.0)
Platelets: 198 10*3/uL (ref 150–400)
RDW: 15.5 % (ref 11.5–15.5)

## 2012-08-24 SURGERY — CERCLAGE, CERVIX, VAGINAL APPROACH
Anesthesia: Spinal | Wound class: Clean Contaminated

## 2012-08-24 MED ORDER — LIDOCAINE IN DEXTROSE 5-7.5 % IV SOLN
INTRAVENOUS | Status: AC
  Filled 2012-08-24: qty 2

## 2012-08-24 MED ORDER — MUPIROCIN 2 % EX OINT
TOPICAL_OINTMENT | CUTANEOUS | Status: AC
  Administered 2012-08-24: 1 via NASAL
  Filled 2012-08-24: qty 22

## 2012-08-24 MED ORDER — MUPIROCIN 2 % EX OINT: TOPICAL_OINTMENT | Freq: Two times a day (BID) | CUTANEOUS | Status: DC

## 2012-08-24 MED ORDER — FENTANYL CITRATE 0.05 MG/ML IJ SOLN
INTRAMUSCULAR | Status: AC
  Filled 2012-08-24: qty 2

## 2012-08-24 MED ORDER — FENTANYL CITRATE 0.05 MG/ML IJ SOLN
INTRAMUSCULAR | Status: AC
  Administered 2012-08-24: 50 ug via INTRAVENOUS
  Filled 2012-08-24: qty 2

## 2012-08-24 MED ORDER — FENTANYL CITRATE 0.05 MG/ML IJ SOLN
25.0000 ug | INTRAMUSCULAR | Status: DC | PRN
  Administered 2012-08-24: 50 ug via INTRAVENOUS

## 2012-08-24 MED ORDER — LIDOCAINE IN DEXTROSE 5-7.5 % IV SOLN
INTRAVENOUS | Status: DC | PRN
  Administered 2012-08-24: 1 mL via INTRATHECAL

## 2012-08-24 MED ORDER — LACTATED RINGERS IV SOLN: INTRAVENOUS | Status: DC

## 2012-08-24 MED ORDER — LACTATED RINGERS IV SOLN
INTRAVENOUS | Status: DC
  Administered 2012-08-24 (×3): via INTRAVENOUS

## 2012-08-24 MED ORDER — 0.9 % SODIUM CHLORIDE (POUR BTL) OPTIME
TOPICAL | Status: DC | PRN
  Administered 2012-08-24: 1000 mL

## 2012-08-24 MED ORDER — FENTANYL CITRATE 0.05 MG/ML IJ SOLN
INTRAMUSCULAR | Status: DC | PRN
  Administered 2012-08-24 (×2): 50 ug via INTRAVENOUS

## 2012-08-24 SURGICAL SUPPLY — 17 items
CATH ROBINSON RED A/P 16FR (CATHETERS) ×1 IMPLANT
CLOTH BEACON ORANGE TIMEOUT ST (SAFETY) ×2 IMPLANT
COUNTER NEEDLE 1200 MAGNETIC (NEEDLE) IMPLANT
GLOVE BIO SURGEON STRL SZ 6.5 (GLOVE) ×2 IMPLANT
GLOVE SURG SS PI 6.5 STRL IVOR (GLOVE) ×1 IMPLANT
GLOVE SURG SS PI 7.0 STRL IVOR (GLOVE) ×1 IMPLANT
GOWN STRL REIN XL XLG (GOWN DISPOSABLE) ×4 IMPLANT
NEEDLE MAYO .5 CIRCLE (NEEDLE) ×2 IMPLANT
PACK VAGINAL MINOR WOMEN LF (CUSTOM PROCEDURE TRAY) ×2 IMPLANT
PAD OB MATERNITY 4.3X12.25 (PERSONAL CARE ITEMS) ×2 IMPLANT
PAD PREP 24X48 CUFFED NSTRL (MISCELLANEOUS) ×2 IMPLANT
SUT POLYDEK 5 CE 75 36 (SUTURE) ×3 IMPLANT
SYR BULB IRRIGATION 50ML (SYRINGE) ×2 IMPLANT
TOWEL OR 17X24 6PK STRL BLUE (TOWEL DISPOSABLE) ×4 IMPLANT
TUBING NON-CON 1/4 X 20 CONN (TUBING) ×2 IMPLANT
WATER STERILE IRR 1000ML POUR (IV SOLUTION) ×2 IMPLANT
YANKAUER SUCT BULB TIP NO VENT (SUCTIONS) ×2 IMPLANT

## 2012-08-24 NOTE — Op Note (Signed)
Operative note  Preoperative diagnosis Preterm pregnancy at 22-3/7 weeks Cervical shortening to less than 1 cm with dynamic change  Postoperative diagnosis Same  Procedure McDonald cerclage  Surgeon Dr. Huel Cote  Anesthesia Spinal  Fluids Estimated blood loss 50 cc Urine output 300 cc after procedure IV fluids 1200 cc LR  Findings Cervix was soft and shortened the green with the ultrasound findings. The os was not opened and the cervix did not have more than 1 cm in length.  Procedure Patient was taken to the operating room where spinal anesthesia was obtained without difficulty. She was then prepped and draped in the normal sterile fashion in the dorsal lithotomy position. An appropriate time out was performed. A speculum was placed within the vagina and the cervix identified somewhat distally in the vagina. A ring forcep was used to grasp the anterior lip and a circumferential suture of 0 Polydek was placed in a pursestring fashion counterclockwise direction.  Once all the stitches were placed the suture was tied down with the knot at the 1:00 position. The cervix was closed and there is no active bleeding noted. All instruments and sponge counts were correct and the patient was then taken to the recovery room in good condition.

## 2012-08-24 NOTE — Progress Notes (Signed)
Patient ID: Priscilla Powell, female   DOB: 14-Apr-1980, 32 y.o.   MRN: [id number] Per patient no changes in dictated H&P.  Brief exam WNL

## 2012-08-24 NOTE — Anesthesia Preprocedure Evaluation (Signed)
Anesthesia Evaluation  Patient identified by MRN, date of birth, ID band Patient awake    Reviewed: Allergy & Precautions, H&P , NPO status , Patient's Chart, lab work & pertinent test results, reviewed documented beta blocker date and time   History of Anesthesia Complications Negative for: history of anesthetic complications  Airway Mallampati: III TM Distance: >3 FB Neck ROM: full    Dental  (+) Teeth Intact   Pulmonary shortness of breath (with pregnancy),  breath sounds clear to auscultation        Cardiovascular negative cardio ROS  Rhythm:regular Rate:Normal     Neuro/Psych negative neurological ROS  negative psych ROS   GI/Hepatic Neg liver ROS, GERD- (with pregnancy)  ,  Endo/Other  Morbid obesity  Renal/GU negative Renal ROS  negative genitourinary   Musculoskeletal   Abdominal   Peds  Hematology negative hematology ROS (+)   Anesthesia Other Findings   Reproductive/Obstetrics (+) Pregnancy ([redacted]w[redacted]d, incompetent cervix)                           Anesthesia Physical Anesthesia Plan  ASA: III  Anesthesia Plan: Spinal   Post-op Pain Management:    Induction:   Airway Management Planned:   Additional Equipment:   Intra-op Plan:   Post-operative Plan:   Informed Consent: I have reviewed the patients History and Physical, chart, labs and discussed the procedure including the risks, benefits and alternatives for the proposed anesthesia with the patient or authorized representative who has indicated his/her understanding and acceptance.     Plan Discussed with: Surgeon and CRNA  Anesthesia Plan Comments:         Anesthesia Quick Evaluation

## 2012-08-24 NOTE — Anesthesia Postprocedure Evaluation (Signed)
  Anesthesia Post-op Note  Anesthesia Post Note  Patient: Priscilla Powell  Procedure(s) Performed: Procedure(s) (LRB): CERCLAGE CERVICAL (N/A)  Anesthesia type: Spinal  Patient location: PACU  Post pain: Pain level controlled  Post assessment: Post-op Vital signs reviewed  Last Vitals:  Filed Vitals:   08/24/12 1415  BP: 118/84  Pulse: 90  Temp:   Resp: 19    Post vital signs: Reviewed  Level of consciousness: awake  Complications: No apparent anesthesia complications

## 2012-08-24 NOTE — Transfer of Care (Signed)
Immediate Anesthesia Transfer of Care Note  Patient: Priscilla Powell  Procedure(s) Performed: Procedure(s): CERCLAGE CERVICAL (N/A)  Patient Location: PACU  Anesthesia Type:Spinal  Level of Consciousness: awake, alert  and oriented  Airway & Oxygen Therapy: Patient Spontanous Breathing  Post-op Assessment: Report given to PACU RN and Post -op Vital signs reviewed and stable  Post vital signs: Reviewed  Complications: No apparent anesthesia complications

## 2012-08-24 NOTE — Anesthesia Procedure Notes (Signed)
Spinal  Patient location during procedure: OR Staffing Performed by: anesthesiologist  Preanesthetic Checklist Completed: patient identified, site marked, surgical consent, pre-op evaluation, timeout performed, IV checked, risks and benefits discussed and monitors and equipment checked Spinal Block Patient position: sitting Prep: site prepped and draped and DuraPrep Patient monitoring: heart rate, cardiac monitor, continuous pulse ox and blood pressure Approach: midline Location: L3-4 Injection technique: single-shot Needle Needle type: Pencan  Needle gauge: 24 G Needle length: 9 cm Assessment Sensory level: T4 Additional Notes Clear free flow CSF on first attempt.  Transient left paresthesia.  Patient tolerated procedure well with no apparent complications.  Jasmine December, MD

## 2012-08-24 NOTE — Brief Op Note (Signed)
08/24/2012  12:38 PM  PATIENT:  Priscilla Powell  32 y.o. female  PRE-OPERATIVE DIAGNOSIS:  incompetent cervix  POST-OPERATIVE DIAGNOSIS:  incompetent cervix  PROCEDURE:  Procedure(s): CERCLAGE CERVICAL (N/A)  SURGEON:  Surgeon(s) and Role:    * Oliver Pila, MD - Primary  PHYSICIAN ASSISTANT:   ANESTHESIA:   spinal  EBL:  Total I/O In: 1000 [I.V.:1000] Out: 325 [Urine:300; Blood:25]  BLOOD ADMINISTERED:none  DRAINS: none   LOCAL MEDICATIONS USED:  NONE  SPECIMEN:  No Specimen  DISPOSITION OF SPECIMEN:  N/A  COUNTS:  YES  TOURNIQUET:  * No tourniquets in log *  DICTATION: .Dragon Dictation  PLAN OF CARE: Discharge to home after PACU  PATIENT DISPOSITION:  PACU - hemodynamically stable.

## 2012-11-01 ENCOUNTER — Inpatient Hospital Stay (HOSPITAL_COMMUNITY)
Admission: AD | Admit: 2012-11-01 | Discharge: 2012-11-01 | Disposition: A | Discharge status: Home / Self Care | Payer: Medicaid Other | Source: Ambulatory Visit | Attending: Obstetrics and Gynecology | Admitting: Obstetrics and Gynecology

## 2012-11-01 DIAGNOSIS — O4700 False labor before 37 completed weeks of gestation, unspecified trimester: Secondary | ICD-10-CM | POA: Insufficient documentation

## 2012-11-01 DIAGNOSIS — N949 Unspecified condition associated with female genital organs and menstrual cycle: Secondary | ICD-10-CM | POA: Insufficient documentation

## 2012-11-01 DIAGNOSIS — O3430 Maternal care for cervical incompetence, unspecified trimester: Secondary | ICD-10-CM | POA: Insufficient documentation

## 2012-11-01 DIAGNOSIS — O4703 False labor before 37 completed weeks of gestation, third trimester: Secondary | ICD-10-CM

## 2012-11-01 DIAGNOSIS — O479 False labor, unspecified: Secondary | ICD-10-CM

## 2012-11-01 LAB — URINE MICROSCOPIC-ADD ON

## 2012-11-01 LAB — WET PREP, GENITAL
Trich, Wet Prep: NONE SEEN
Yeast Wet Prep HPF POC: NONE SEEN

## 2012-11-01 LAB — URINALYSIS, ROUTINE W REFLEX MICROSCOPIC
Ketones, ur: NEGATIVE mg/dL
Urobilinogen, UA: 0.2 mg/dL (ref 0.0–1.0)

## 2012-11-01 LAB — AMNISURE RUPTURE OF MEMBRANE (ROM) NOT AT ARMC: Amnisure ROM: NEGATIVE

## 2012-11-01 MED ORDER — OXYCODONE-ACETAMINOPHEN 5-325 MG PO TABS
1.0000 | ORAL_TABLET | Freq: Once | ORAL | Status: AC
  Administered 2012-11-01: 1 via ORAL
  Filled 2012-11-01: qty 1

## 2012-11-01 MED ORDER — NIFEDIPINE 20 MG PO CAPS: 20.0000 mg | ORAL_CAPSULE | Freq: Three times a day (TID) | ORAL | Status: DC

## 2012-11-01 MED ORDER — PROMETHAZINE HCL 25 MG PO TABS
25.0000 mg | ORAL_TABLET | Freq: Once | ORAL | Status: AC
  Administered 2012-11-01: 25 mg via ORAL
  Filled 2012-11-01: qty 1

## 2012-11-01 MED ORDER — NIFEDIPINE 10 MG PO CAPS
20.0000 mg | ORAL_CAPSULE | Freq: Once | ORAL | Status: AC
  Administered 2012-11-01: 20 mg via ORAL
  Filled 2012-11-01: qty 2

## 2012-11-01 NOTE — MAU Provider Note (Signed)
History     CSN: [id number]  Arrival date and time: 11/01/12 4098   First Provider Initiated Contact with Patient 11/01/12 [phone]      Chief Complaint  Patient presents with  . Abdominal Pain   HPI Comments: Priscilla Powell 32 y.o. G1P0 presents to MAU with pelvic pain/ pressure ongoing since 6 am. She spoke with Dr Ellyn Hack and was told to come to MAU. Denies any bleeding or leaking of fluids. Her pain is 8/10 and constant. She has a cerclage.        Past Medical History  Diagnosis Date  . Vertigo   . Headache(784.0)   . BV (bacterial vaginosis)   . UTI (lower urinary tract infection)   . Trichimoniasis   . Bilateral external ear infections     Past Surgical History  Procedure Laterality Date  . No past surgeries    . Cryoablation    . Cervical cerclage N/A 08/24/2012    Procedure: CERCLAGE CERVICAL;  Surgeon: Oliver Pila, MD;  Location: [facility];  Service: Gynecology;  Laterality: N/A;    Family History  Problem Relation Age of Onset  . Diabetes Mother     History  Substance Use Topics  . Smoking status: Never Smoker   . Smokeless tobacco: Never Used  . Alcohol Use: No    Allergies: No Known Allergies  Prescriptions prior to admission  Medication Sig Dispense Refill  . Prenatal Vit-Fe Fumarate-FA (PRENATAL MULTIVITAMIN) TABS Take 1 tablet by mouth at bedtime.        Review of Systems  Constitutional: Negative.   HENT: Negative.   Eyes: Negative.   Respiratory: Negative.   Cardiovascular: Negative.   Gastrointestinal: Negative.   Genitourinary:       Pain in lower pelvic regions  Skin: Negative.   Neurological: Negative.   Psychiatric/Behavioral: Negative.    Physical Exam   Blood pressure 117/76, pulse 105, temperature 98.8 F (37.1 C), temperature source Oral, resp. rate 18, height 5' (1.524 m), weight 279 lb (126.554 kg), last menstrual period 03/20/2012.  Physical Exam  Constitutional: She appears well-developed and well-nourished. No  distress.  obese  HENT:  Head: Normocephalic and atraumatic.  Eyes: Pupils are equal, round, and reactive to light.  GI: Soft. There is tenderness.  Genitourinary:  External: negative Vagina: no pooling of fluids, thick white, non odorous discharge Cervix: Cerclage Biman: Closed     Results for orders placed during the hospital encounter of 11/01/12 (from the past 24 hour(s))  URINALYSIS, ROUTINE W REFLEX MICROSCOPIC     Status: Abnormal   Collection Time    11/01/12  9:10 AM      Result Value Range   Color, Urine YELLOW  YELLOW   APPearance CLEAR  CLEAR   Specific Gravity, Urine 1.020  1.005 - 1.030   pH 6.0  5.0 - 8.0   Glucose, UA NEGATIVE  NEGATIVE mg/dL   Hgb urine dipstick NEGATIVE  NEGATIVE   Bilirubin Urine NEGATIVE  NEGATIVE   Ketones, ur NEGATIVE  NEGATIVE mg/dL   Protein, ur NEGATIVE  NEGATIVE mg/dL   Urobilinogen, UA 0.2  0.0 - 1.0 mg/dL   Nitrite NEGATIVE  NEGATIVE   Leukocytes, UA SMALL (*) NEGATIVE  URINE MICROSCOPIC-ADD ON     Status: None   Collection Time    11/01/12  9:10 AM      Result Value Range   Squamous Epithelial / LPF RARE  RARE   WBC, UA 3-6  <3 WBC/hpf  Bacteria, UA RARE  RARE  WET PREP, GENITAL     Status: Abnormal   Collection Time    11/01/12  9:50 AM      Result Value Range   Yeast Wet Prep HPF POC NONE SEEN  NONE SEEN   Trich, Wet Prep NONE SEEN  NONE SEEN   Clue Cells Wet Prep HPF POC FEW (*) NONE SEEN   WBC, Wet Prep HPF POC FEW (*) NONE SEEN  FETAL FIBRONECTIN     Status: Abnormal   Collection Time    11/01/12  9:50 AM      Result Value Range   Fetal Fibronectin POSITIVE (*) NEGATIVE  AMNISURE RUPTURE OF MEMBRANE (ROM)     Status: None   Collection Time    11/01/12 10:32 AM      Result Value Range   Amnisure ROM NEGATIVE       MAU Course  Procedures  MDM  Spoke with Dr Senaida Ores who advised amnisure and procardia 20 mg, will reassess after medications have been given. After pt is feeling better advised to send home  with procardia 20 mg po q6 hours for contractions and follow up in office  Assessment and Plan   A: preterm contractions with cerclage  P: Send home with procardia 20 mg q 6 hours Follow up in office as needed Rest/ Fluids   Carolynn Serve 11/01/2012, 10:07 AM

## 2012-11-01 NOTE — MAU Note (Signed)
Pt reports she started having pain in her vaginal area around 6 this morning. Pain is constant Good fetal movement reported. Pt reprots some fluid leaking out as well.

## 2012-11-10 ENCOUNTER — Inpatient Hospital Stay (HOSPITAL_COMMUNITY)
Admission: AD | Admit: 2012-11-10 | Discharge: 2012-11-13 | DRG: 778 | Disposition: A | Discharge status: Home / Self Care | Payer: Medicaid Other | Source: Ambulatory Visit | Attending: Obstetrics and Gynecology | Admitting: Obstetrics and Gynecology

## 2012-11-10 ENCOUNTER — Encounter (HOSPITAL_COMMUNITY): Payer: Self-pay | Admitting: Obstetrics and Gynecology

## 2012-11-10 DIAGNOSIS — O3430 Maternal care for cervical incompetence, unspecified trimester: Secondary | ICD-10-CM | POA: Diagnosis present

## 2012-11-10 DIAGNOSIS — O4700 False labor before 37 completed weeks of gestation, unspecified trimester: Principal | ICD-10-CM | POA: Diagnosis present

## 2012-11-10 DIAGNOSIS — O3433 Maternal care for cervical incompetence, third trimester: Secondary | ICD-10-CM

## 2012-11-10 DIAGNOSIS — O4690 Antepartum hemorrhage, unspecified, unspecified trimester: Secondary | ICD-10-CM

## 2012-11-10 DIAGNOSIS — O4693 Antepartum hemorrhage, unspecified, third trimester: Secondary | ICD-10-CM

## 2012-11-10 DIAGNOSIS — O4703 False labor before 37 completed weeks of gestation, third trimester: Secondary | ICD-10-CM

## 2012-11-10 HISTORY — DX: Maternal care for cervical incompetence, unspecified trimester: O34.30

## 2012-11-10 HISTORY — DX: False labor before 37 completed weeks of gestation, unspecified trimester: O47.00

## 2012-11-10 HISTORY — DX: Antepartum hemorrhage, unspecified, unspecified trimester: O46.90

## 2012-11-10 MED ORDER — BUTORPHANOL TARTRATE 1 MG/ML IJ SOLN
1.0000 mg | INTRAMUSCULAR | Status: DC | PRN
  Administered 2012-11-10 – 2012-11-11 (×2): 1 mg via INTRAVENOUS
  Filled 2012-11-10 (×3): qty 1

## 2012-11-10 MED ORDER — ACETAMINOPHEN 325 MG PO TABS: 650.0000 mg | ORAL_TABLET | ORAL | Status: DC | PRN

## 2012-11-10 MED ORDER — PENICILLIN G POTASSIUM 5000000 UNITS IJ SOLR
5.0000 10*6.[IU] | Freq: Once | INTRAVENOUS | Status: AC
  Administered 2012-11-10: 5 10*6.[IU] via INTRAVENOUS
  Filled 2012-11-10: qty 5

## 2012-11-10 MED ORDER — PENICILLIN G POTASSIUM 5000000 UNITS IJ SOLR
2.5000 10*6.[IU] | INTRAVENOUS | Status: DC
  Administered 2012-11-11: 2.5 10*6.[IU] via INTRAVENOUS
  Filled 2012-11-10 (×4): qty 2.5

## 2012-11-10 MED ORDER — MAGNESIUM SULFATE BOLUS VIA INFUSION
6.0000 g | Freq: Once | INTRAVENOUS | Status: AC
  Administered 2012-11-10: 6 g via INTRAVENOUS
  Filled 2012-11-10: qty 500

## 2012-11-10 MED ORDER — ZOLPIDEM TARTRATE 5 MG PO TABS: 5.0000 mg | ORAL_TABLET | Freq: Every evening | ORAL | Status: DC | PRN

## 2012-11-10 MED ORDER — MAGNESIUM SULFATE 40 G IN LACTATED RINGERS - SIMPLE
2.0000 g/h | INTRAVENOUS | Status: DC
  Administered 2012-11-11: 2 g/h via INTRAVENOUS
  Filled 2012-11-10 (×2): qty 500

## 2012-11-10 MED ORDER — DOCUSATE SODIUM 100 MG PO CAPS
100.0000 mg | ORAL_CAPSULE | Freq: Every day | ORAL | Status: DC
  Administered 2012-11-11 – 2012-11-13 (×3): 100 mg via ORAL
  Filled 2012-11-10 (×3): qty 1

## 2012-11-10 MED ORDER — PRENATAL MULTIVITAMIN CH
1.0000 | ORAL_TABLET | Freq: Every day | ORAL | Status: DC
  Administered 2012-11-12 – 2012-11-13 (×2): 1 via ORAL
  Filled 2012-11-10 (×2): qty 1

## 2012-11-10 MED ORDER — CALCIUM CARBONATE ANTACID 500 MG PO CHEW
2.0000 | CHEWABLE_TABLET | ORAL | Status: DC | PRN
  Administered 2012-11-13 (×2): 200 mg via ORAL
  Filled 2012-11-10 (×2): qty 1

## 2012-11-10 MED ORDER — BETAMETHASONE SOD PHOS & ACET 6 (3-3) MG/ML IJ SUSP
12.0000 mg | INTRAMUSCULAR | Status: AC
  Administered 2012-11-10 – 2012-11-11 (×2): 12 mg via INTRAMUSCULAR
  Filled 2012-11-10 (×2): qty 2

## 2012-11-10 MED ORDER — LACTATED RINGERS IV SOLN
INTRAVENOUS | Status: DC
  Administered 2012-11-10: 125 mL/h via INTRAVENOUS
  Administered 2012-11-11: 11:00:00 via INTRAVENOUS

## 2012-11-10 NOTE — MAU Note (Signed)
Patient states she started to have bright red vaginal bleeding with clots and abdominal cramping around 6pm. Was seen in the office yesterday, cervix was closed and cerclage intact.

## 2012-11-10 NOTE — MAU Provider Note (Signed)
History     CSN: [id number]  Arrival date and time: 11/10/12 2000   First Provider Initiated Contact with Patient 11/10/12 2051      Chief Complaint  Patient presents with  . Vaginal Bleeding  . Abdominal Pain   HPI Priscilla Powell is a 32 y.o. G1P0 at [redacted]w[redacted]d who presents to MAU today with complaint of bleeding since 1800 today. The patient had a cerclage placed on 08/24/12. The patient states that she started noting dark brown blood first that has changed to bright red now. The patient has seen a few small clots also. She states that she is having occasional contractions and abdominal pain that she rates at 7/10 now. She reports good fetal movement.   OB History   Grav Para Term Preterm Abortions TAB SAB Ect Mult Living   1               Past Medical History  Diagnosis Date  . Vertigo   . Headache(784.0)   . BV (bacterial vaginosis)   . UTI (lower urinary tract infection)   . Trichimoniasis   . Bilateral external ear infections   . Cervical incompetence affecting management of pregnancy, antepartum 11/10/2012  . Vaginal bleeding in pregnancy 11/10/2012  . Preterm uterine contractions, antepartum 11/10/2012    Past Surgical History  Procedure Laterality Date  . No past surgeries    . Cryoablation    . Cervical cerclage N/A 08/24/2012    Procedure: CERCLAGE CERVICAL;  Surgeon: Oliver Pila, MD;  Location: [facility];  Service: Gynecology;  Laterality: N/A;    Family History  Problem Relation Age of Onset  . Diabetes Mother     History  Substance Use Topics  . Smoking status: Never Smoker   . Smokeless tobacco: Never Used  . Alcohol Use: No    Allergies: No Known Allergies  Prescriptions prior to admission  Medication Sig Dispense Refill  . NIFEdipine (PROCARDIA) 20 MG capsule Take 20 mg by mouth 3 (three) times daily as needed (for contractions).      . Prenatal Vit-Fe Fumarate-FA (PRENATAL MULTIVITAMIN) TABS Take 1 tablet by mouth at bedtime.      .  progesterone 200 MG SUPP Place 200 mg vaginally at bedtime.        Review of Systems  Constitutional: Negative for fever and malaise/fatigue.  Gastrointestinal: Positive for abdominal pain.  Genitourinary:       + vaginal bleeding   Physical Exam   Blood pressure 128/77, pulse 106, temperature 98.6 F (37 C), temperature source Oral, resp. rate 20, height 5' (1.524 m), weight 275 lb (124.739 kg), last menstrual period 03/20/2012.  Physical Exam  Constitutional: She is oriented to person, place, and time. She appears well-developed and well-nourished. No distress.  HENT:  Head: Normocephalic and atraumatic.  Cardiovascular: Normal rate.   Respiratory: Effort normal.  GI: Soft. She exhibits no distension and no mass. There is tenderness (mild tenderness to palpation of the mid-abdomen). There is no rebound and no guarding.  Genitourinary: There is bleeding (small amount of bleeding noted in the vagina with 2 small clots) around the vagina. No vaginal discharge found.  Cerclage appears intact  Neurological: She is alert and oriented to person, place, and time.  Skin: Skin is warm and dry. No erythema.  Psychiatric: She has a normal mood and affect.  Dilation: 1.5 Effacement (%): 80 Presentation: Vertex Exam by:: Dr Ellyn Hack   MAU Course  Procedures None  MDM Discussed with Dr.  Bovard. Admit to antenatal.   Assessment and Plan  A: Vaginal bleeding in pregnancy, third trimester  P: Admit to Antenatal  Freddi Starr, PA-C  11/10/2012, 11:01 PM

## 2012-11-10 NOTE — H&P (Signed)
Priscilla Powell is a 32 y.o. female  G1 at 16+ with cervical incompetence s/p cerclage 08/24/12.  Pt with brownish discharge and then bright red bleeding, small.  Cervix 1cm dilated /80 per NP, cerclage intact.  Some ctx.  +FM.  Also late to care.    Maternal Medical History:  Reason for admission: Vaginal bleeding.   Contractions: Frequency: irregular.   Perceived severity is moderate.    Fetal activity: Perceived fetal activity is normal.    Prenatal Complications - Diabetes: none.    OB History   Grav Para Term Preterm Abortions TAB SAB Ect Mult Living   1             G1 present - cerclage McDonald knot at 1 pm No abn pap, trich Past Medical History  Diagnosis Date  . Vertigo   . Headache(784.0)   . BV (bacterial vaginosis)   . UTI (lower urinary tract infection)   . Trichimoniasis   . Bilateral external ear infections   . Cervical incompetence affecting management of pregnancy, antepartum 11/10/2012  . Vaginal bleeding in pregnancy 11/10/2012  . Preterm uterine contractions, antepartum 11/10/2012   Past Surgical History  Procedure Laterality Date  . No past surgeries    . Cryoablation    . Cervical cerclage N/A 08/24/2012    Procedure: CERCLAGE CERVICAL;  Surgeon: Oliver Pila, MD;  Location: [facility];  Service: Gynecology;  Laterality: N/A;   Family History: family history includes Diabetes in her mother.also HTN Social History:  reports that she has never smoked. She has never used smokeless tobacco. She reports that she does not drink alcohol or use illicit drugs. Meds Procardia, PNV All NKDA   Prenatal Transfer Tool  Maternal Diabetes: No Genetic Screening: Normal Maternal Ultrasounds/Referrals: Abnormal:  Findings:   Other:shortened cervix Fetal Ultrasounds or other Referrals:  None Maternal Substance Abuse:  No Significant Maternal Medications:  None Significant Maternal Lab Results:  None Other Comments:  BMZ 10/24, 10/25  Review of Systems   Constitutional: Negative.   HENT: Negative.   Eyes: Negative.   Respiratory: Negative.   Cardiovascular: Negative.   Gastrointestinal: Negative.   Genitourinary: Negative.   Musculoskeletal: Negative.   Skin: Negative.   Neurological: Negative.   Psychiatric/Behavioral: Negative.     Dilation: 1 Effacement (%): 80 Exam by:: [name] Blood pressure 137/78, pulse 104, temperature 98.5 F (36.9 C), temperature source Oral, resp. rate 16, height 5' (1.524 m), weight 124.739 kg (275 lb), last menstrual period 03/20/2012. Maternal Exam:  Uterine Assessment: Contraction strength is moderate.  Contraction frequency is regular.   Abdomen: Fundal height is diff to assess secondary to habitus, appropriate for gestation.    Introitus: Normal vulva. Normal vagina.  Cervix: Cervix evaluated by sterile speculum exam and digital exam.     Physical Exam  Constitutional: She is oriented to person, place, and time. She appears well-developed and well-nourished.  HENT:  Head: Normocephalic and atraumatic.  Cardiovascular: Normal rate and regular rhythm.   Respiratory: Effort normal and breath sounds normal. No respiratory distress. She has no wheezes.  GI: Soft. Bowel sounds are normal. She exhibits no distension. There is no tenderness.  Musculoskeletal: Normal range of motion.  Neurological: She is alert and oriented to person, place, and time.  Skin: Skin is warm and dry.  Psychiatric: She has a normal mood and affect. Her behavior is normal.    Prenatal labs: ABO, Rh: --/--/B POS (04/30 1513) Antibody:  neg Rubella:  immune RPR:  NR HBsAg:   neg HIV:   neg GBS:   unknown Hgb 11.3/Plt 242K/ glucola 117/Pap WNL/ GC neg/ Chl neg/ CF neg/ CFP neg/ Hgb electro WNL/ Ur Cx neg  [place] followed for CL  U/S EDC 12/25/12 nl anat, post plac, female  Assessment/Plan: 32yo G1 at 33+ with VB and ctx GBBS to collect [place] in AM Close monitoring BMZ x2   BOVARD,Takahiro Godinho 11/10/2012, 9:53  PM

## 2012-11-10 NOTE — Progress Notes (Signed)
Patient ID: Priscilla Powell, female   DOB: July 26, 1980, 32 y.o.   MRN: [id number]  CTSP secondary to increasing pain  AFVSS gen NAD  SVE 1+/80/0, cerclage feels intact Ctx q 2-4 min FHTs reassuring  Mg for CP prophylaxis BMZ for lung maturity PCN for GBBS prophylaxis

## 2012-11-10 NOTE — Plan of Care (Signed)
Problem: Consults Goal: Birthing Suites Patient Information Press F2 to bring up selections list Outcome: Completed/Met Date Met:  11/10/12  Pt < [redacted] weeks EGA

## 2012-11-10 NOTE — Consult Note (Signed)
Neonatology Consult to Antenatal Patient:  I was asked by Dr. Ellyn Hack to see this patient in order to provide antenatal counseling due to incompetent cervix and onset of PTL.  Priscilla Powell is admitted tonight at 55 5/[redacted] weeks GA. She presented with some vaginal bleeding, and the cerclage was intact. She is currently having contractions. She is getting BMZ, Magnesium sulfate neuroprophylaxis, and IV Pen G, awaiting results of GBS testing. The baby is female.  I spoke with the patient and her mother. We discussed the worst case of delivery in the next 1-2 days, including usual DR management, possible respiratory complications and need for support, IV access, feedings (mother desires breast feeding), LOS, Mortality and Morbidity, and long term outcomes. She did not have any questions at this time. I offered a NICU tour to any interested family members and would be glad to come back if she has more questions later.  Thank you for asking me to see this patient.  Doretha Sou, MD Neonatologist  The total length of face-to-face or floor/unit time for this encounter was 20 minutes. Counseling and/or coordination of care was 15 minutes of the above.

## 2012-11-11 ENCOUNTER — Inpatient Hospital Stay (HOSPITAL_COMMUNITY): Payer: Medicaid Other

## 2012-11-11 LAB — CBC
HCT: 31.1 % — ABNORMAL LOW (ref 36.0–46.0)
HCT: 32.7 % — ABNORMAL LOW (ref 36.0–46.0)
Hemoglobin: 10.3 g/dL — ABNORMAL LOW (ref 12.0–15.0)
MCH: 24.8 pg — ABNORMAL LOW (ref 26.0–34.0)
MCHC: 33 g/dL (ref 30.0–36.0)
MCHC: 33.1 g/dL (ref 30.0–36.0)
MCV: 75.2 fL — ABNORMAL LOW (ref 78.0–100.0)
RBC: 4.22 MIL/uL (ref 3.87–5.11)
RDW: 16.4 % — ABNORMAL HIGH (ref 11.5–15.5)
RDW: 17.2 % — ABNORMAL HIGH (ref 11.5–15.5)

## 2012-11-11 MED ORDER — OXYTOCIN BOLUS FROM INFUSION: 500.0000 mL | INTRAVENOUS | Status: DC

## 2012-11-11 MED ORDER — IBUPROFEN 600 MG PO TABS: 600.0000 mg | ORAL_TABLET | Freq: Four times a day (QID) | ORAL | Status: DC | PRN

## 2012-11-11 MED ORDER — OXYCODONE-ACETAMINOPHEN 5-325 MG PO TABS: 1.0000 | ORAL_TABLET | ORAL | Status: DC | PRN

## 2012-11-11 MED ORDER — ACETAMINOPHEN 325 MG PO TABS: 650.0000 mg | ORAL_TABLET | ORAL | Status: DC | PRN

## 2012-11-11 MED ORDER — LACTATED RINGERS IV SOLN
INTRAVENOUS | Status: DC
  Administered 2012-11-12: 01:00:00 via INTRAVENOUS

## 2012-11-11 MED ORDER — LACTATED RINGERS IV SOLN: INTRAVENOUS | Status: DC

## 2012-11-11 MED ORDER — PENICILLIN G POTASSIUM 5000000 UNITS IJ SOLR
5.0000 10*6.[IU] | Freq: Once | INTRAVENOUS | Status: DC
  Filled 2012-11-11: qty 5

## 2012-11-11 MED ORDER — PENICILLIN G POTASSIUM 5000000 UNITS IJ SOLR
2.5000 10*6.[IU] | INTRAVENOUS | Status: DC
  Administered 2012-11-11 – 2012-11-12 (×4): 2.5 10*6.[IU] via INTRAVENOUS
  Filled 2012-11-11 (×5): qty 2.5

## 2012-11-11 MED ORDER — ERYTHROMYCIN BASE 250 MG PO TBEC: 250.0000 mg | DELAYED_RELEASE_TABLET | Freq: Four times a day (QID) | ORAL | Status: DC

## 2012-11-11 MED ORDER — ONDANSETRON HCL 4 MG/2ML IJ SOLN: 4.0000 mg | Freq: Four times a day (QID) | INTRAMUSCULAR | Status: DC | PRN

## 2012-11-11 MED ORDER — SODIUM CHLORIDE 0.9 % IV SOLN
500.0000 mg | Freq: Three times a day (TID) | INTRAVENOUS | Status: DC
  Administered 2012-11-11: 500 mg via INTRAVENOUS
  Filled 2012-11-11 (×2): qty 10

## 2012-11-11 MED ORDER — CITRIC ACID-SODIUM CITRATE 334-500 MG/5ML PO SOLN: 30.0000 mL | ORAL | Status: DC | PRN

## 2012-11-11 MED ORDER — PENICILLIN G POTASSIUM 5000000 UNITS IJ SOLR
5.0000 10*6.[IU] | Freq: Once | INTRAVENOUS | Status: AC
  Administered 2012-11-11: 5 10*6.[IU] via INTRAVENOUS
  Filled 2012-11-11: qty 5

## 2012-11-11 MED ORDER — OXYTOCIN 40 UNITS IN LACTATED RINGERS INFUSION - SIMPLE MED: 62.5000 mL/h | INTRAVENOUS | Status: DC

## 2012-11-11 MED ORDER — AMOXICILLIN 250 MG PO CAPS: 250.0000 mg | ORAL_CAPSULE | Freq: Four times a day (QID) | ORAL | Status: DC

## 2012-11-11 MED ORDER — MAGNESIUM SULFATE 40 G IN LACTATED RINGERS - SIMPLE
2.0000 g/h | INTRAVENOUS | Status: AC
  Administered 2012-11-12: 2 g/h via INTRAVENOUS
  Filled 2012-11-11 (×2): qty 500

## 2012-11-11 MED ORDER — LIDOCAINE HCL (PF) 1 % IJ SOLN: 30.0000 mL | INTRAMUSCULAR | Status: DC | PRN

## 2012-11-11 MED ORDER — LACTATED RINGERS IV SOLN: 500.0000 mL | INTRAVENOUS | Status: DC | PRN

## 2012-11-11 MED ORDER — SODIUM CHLORIDE 0.9 % IV SOLN
2.0000 g | Freq: Four times a day (QID) | INTRAVENOUS | Status: DC
  Administered 2012-11-11 (×2): 2 g via INTRAVENOUS
  Filled 2012-11-11 (×2): qty 2000

## 2012-11-11 MED ORDER — BUTORPHANOL TARTRATE 1 MG/ML IJ SOLN: 1.0000 mg | INTRAMUSCULAR | Status: DC | PRN

## 2012-11-11 MED ORDER — PENICILLIN G POTASSIUM 5000000 UNITS IJ SOLR
2.5000 10*6.[IU] | INTRAVENOUS | Status: DC
  Administered 2012-11-11: 2.5 10*6.[IU] via INTRAVENOUS
  Filled 2012-11-11 (×4): qty 2.5

## 2012-11-11 NOTE — Progress Notes (Signed)
Patient ID: Priscilla Powell, female   DOB: February 28, 1980, 32 y.o.   MRN: [id number]  See above RN note.  Pt with possible ROM.  She is 33+ wk, if ROM will induce at 34 wk.  Has [place] in am for growth, position, and AFI.  Continue close monitoring.  Pt much more comfortable after 1 dose stadol.  Will change to Ampicillin/erythro for latency.    FHTs reasssuring toco occ  Will continue close monitoring and BMZ

## 2012-11-11 NOTE — Progress Notes (Signed)
Pt sitting in rocking chair--difficult to trace at times--US adjusted

## 2012-11-11 NOTE — Progress Notes (Signed)
Patient ID: Priscilla Powell, female   DOB: 18-Jul-1980, 32 y.o.   MRN: [id number]  Pt transferred to L&D.  Cerclage easily removed in entirity.  SVE 130's good variability. Category 1, variables w cerclage removal toco q 5-59min  SVE 2/90/0, BBOW

## 2012-11-11 NOTE — Progress Notes (Signed)
Cerclage removed

## 2012-11-11 NOTE — Progress Notes (Signed)
Patient ID: Priscilla Powell, female   DOB: [birth year], 32 y.o.   MRN: [id number]  No c/o's except continued watery discharge.  +FM.  Some gushes of VB, rare ctx  AFVSS gen NAD Abd soft, FNT  FHTs 150's mod var toco rare  SVE 1/80/-3 last SVE  Unable to fern. [place] for growth, presentation, eval placenta, AFI BMZ 10/24. 10/25 at 23:05pm Amp/ Erythro for poss ROM Mg S04 for CP prophylaxis D/w pt POC

## 2012-11-11 NOTE — Progress Notes (Signed)
Patient called out to nurses station stating she has to void; upon entering patients room she states "i'm already wet, its leaking out" patient then states that she still feels like she needs to void so RN assists patient to BR; upon ambulating to BR bloody fluid is noted to be leaking out of patient who states "its running down my leg, its not stopping" assisted patient back to bed after emptying bladder, reapplied EFM and call Dr. Ellyn Hack to notify of situation. Dr. Ellyn Hack alerted that patient is leaking fluid that is bloody in nature and patient is still having vaginal bleeding that resembles bloody show, she is also notified that upon last ordered SVE patient was still 1/80/-3, patient c/o ctx but states they are "not as bad as before" and does not require pain medication at this time; she was notified of decreased variability and occ contractions; FERN test ordered and performed but is inconclusive at this time. Will notify Dr. Ellyn Hack of findings upon arrival at hospital

## 2012-11-11 NOTE — Progress Notes (Signed)
Patient ID: Priscilla Powell, female   DOB: 1981/01/06, 32 y.o.   MRN: [id number]  Pt has been stable since cerclage removed, plan to d/c magnesium, d/c PCN, SLIV regular diet; transfer to [facility] with plan to d/c home on Sunday after BPP  As orders being reviewed with pt, Magnesium d/c'd and pt started cramping, painfully.  SVE 2/90/0, BBOW, bloody show  Cancel transfer to [facility], restart Mg, restart PCN, cont IVF, back to clears.  D/w pt reversal of plan, will continue CEFM/toco.  BMZ at 23:05  FHTs140's category 1 toco rare   [place] vtx, post plac, nl AFI, BPP 6/8, growth at 33%, nl anat.  Repeat BPP 10/26

## 2012-11-11 NOTE — Progress Notes (Addendum)
Patient ID: Priscilla Powell, female   DOB: 1980/12/27, 32 y.o.   MRN: [id number]  Pt with continued and increased VB and ctx.  Cerclage intact, but pt c/o pain and pressure.  D/w MFM, recc transfer to L&D, clip stitch and expectant mgmt.  Continue Magnesium, BMZ this pm if still pregnant, GBBS prophylaxis.    D/w pt and pt's mother POC, Q's answered Continue clear liquid diet. Shower after cerclage removed NICU aware.

## 2012-11-12 ENCOUNTER — Inpatient Hospital Stay (HOSPITAL_COMMUNITY): Payer: Medicaid Other

## 2012-11-12 MED ORDER — PROGESTERONE MICRONIZED 200 MG PO CAPS
200.0000 mg | ORAL_CAPSULE | Freq: Every day | ORAL | Status: DC
  Administered 2012-11-12: 200 mg via VAGINAL
  Filled 2012-11-12 (×2): qty 1

## 2012-11-12 MED ORDER — PROGESTERONE 200 MG VA SUPP
200.0000 mg | Freq: Every day | VAGINAL | Status: DC
  Filled 2012-11-12: qty 1

## 2012-11-12 MED ORDER — NIFEDIPINE 10 MG PO CAPS: 10.0000 mg | ORAL_CAPSULE | ORAL | Status: DC | PRN

## 2012-11-12 MED ORDER — NIFEDIPINE 10 MG PO CAPS
10.0000 mg | ORAL_CAPSULE | ORAL | Status: DC | PRN
  Administered 2012-11-12 – 2012-11-13 (×3): 10 mg via ORAL
  Filled 2012-11-12 (×3): qty 1

## 2012-11-12 NOTE — Progress Notes (Signed)
Patient ID: Priscilla Powell, female   DOB: 1981-01-14, 32 y.o.   MRN: [id number]  Plan to d/c magnesium at 11pm, procardia prn. D/w pt, POC FHTs reassuring no toco

## 2012-11-12 NOTE — Progress Notes (Signed)
Patient ID: Priscilla Powell, female   DOB: September 28, 1980, 32 y.o.   MRN: [id number]  Pt feels good.  Is hungry!!!!  +FM, no LOF, no VB, uterine irritability.  Pt states feels good.    AFVSS gen NAD FHTs 135 mod var.  Category 1 toco uterine irritability  Will continue Magnesium for 24hr after last BMZ. May transfer to [facility] Regular diet for lunch Cont Mg, d/c PCN,  Close monitoring

## 2012-11-13 LAB — CULTURE, BETA STREP (GROUP B ONLY)

## 2012-11-13 MED ORDER — NIFEDIPINE 20 MG PO CAPS: 20.0000 mg | ORAL_CAPSULE | Freq: Four times a day (QID) | ORAL | Status: DC | PRN

## 2012-11-13 MED ORDER — NIFEDIPINE 10 MG PO CAPS
20.0000 mg | ORAL_CAPSULE | ORAL | Status: DC | PRN
  Administered 2012-11-13: 20 mg via ORAL
  Filled 2012-11-13: qty 2

## 2012-11-13 MED ORDER — FAMOTIDINE 20 MG PO TABS
20.0000 mg | ORAL_TABLET | Freq: Two times a day (BID) | ORAL | Status: DC
  Administered 2012-11-13: 20 mg via ORAL
  Filled 2012-11-13: qty 1

## 2012-11-13 NOTE — Progress Notes (Signed)
Doing ok today with prn Procardia, some ctx off and on, some spotting Afeb, VSS FHT- Cat I, irreg, rare ctx Will send home on Procardia prn, call with any changes

## 2012-11-13 NOTE — Discharge Summary (Signed)
Physician Discharge Summary  Patient ID: Makinsley Schiavi MRN: [id number] DOB/AGE: June 27, 1980 32 y.o.  Admit date: 11/10/2012 Discharge date: 11/13/2012  Admission Diagnoses:  IUP at 33+ weeks, incompetent cervix, preterm labor  Discharge Diagnoses: Same Principal Problem:   Vaginal bleeding in pregnancy Active Problems:   Cervical incompetence affecting management of pregnancy, antepartum   Preterm uterine contractions, antepartum   Discharged Condition: good  Hospital Course: Pt admitted by Dr. Ellyn Hack, received magnesium for tocolysis and CP prophylaxis while received course of steroids.  Cerclage removed due to persistent ctx and some bleeding.  Ctx eventually quiesced, did ok on PO Procardia and stable for discharge home.  Discharge Exam: Blood pressure 116/55, pulse 89, temperature 98.3 F (36.8 C), temperature source Oral, resp. rate 20, height 5' (1.524 m), weight 124.739 kg (275 lb), last menstrual period 03/20/2012, SpO2 99.00%. General appearance: alert  Disposition: 01-Home or Self Care  Discharge Orders   Future Orders Complete By Expires   Discharge activity:  No Restrictions  As directed    Discharge diet:  No restrictions  As directed    Notify physician for a general feeling that "something is not right"  As directed    Notify physician for increase or change in vaginal discharge  As directed    Notify physician for intestinal cramps, with or without diarrhea, sometimes described as "gas pain"  As directed    Notify physician for leaking of fluid  As directed    Notify physician for low, dull backache, unrelieved by heat or Tylenol  As directed    Notify physician for menstrual like cramps  As directed    Notify physician for pelvic pressure  As directed    Notify physician for uterine contractions.  These may be painless and feel like the uterus is tightening or the baby is  "balling up"  As directed    Notify physician for vaginal bleeding  As directed    PRETERM  LABOR:  Includes any of the follwing symptoms that occur between 20 - [redacted] weeks gestation.  If these symptoms are not stopped, preterm labor can result in preterm delivery, placing your baby at risk  As directed        Medication List         NIFEdipine 20 MG capsule  Commonly known as:  PROCARDIA  Take 1 capsule (20 mg total) by mouth 4 (four) times daily as needed (for contractions).     prenatal multivitamin Tabs tablet  Take 1 tablet by mouth at bedtime.     progesterone 200 MG Supp  Place 200 mg vaginally at bedtime.           Follow-up Information   Follow up with Eldridge Marcott D, MD. (f/u as scheduled)    Specialty:  Obstetrics and Gynecology   Contact information:   [address] [place] [address] [phone]       Signed: Zenaida Niece 11/13/2012, 5:18 PM

## 2012-11-13 NOTE — Progress Notes (Signed)
Ur chart review completed.  

## 2012-11-13 NOTE — Progress Notes (Signed)
Having some ctx Will increase dose of Procardia to 20 mg q 4 hrs prn, would not use Magnesium anymore

## 2012-11-13 NOTE — Progress Notes (Signed)
Patient ID: Priscilla Powell, female   DOB: 11-24-1980, 32 y.o.   MRN: [id number]  Some cramping this AM, procardia at 7am  AFVSS gen NAD FHTs 140's good var, category 1 toco irr  SVE 3/100/0  Will monitor for now.

## 2012-11-14 ENCOUNTER — Encounter (HOSPITAL_COMMUNITY): Payer: Medicaid Other | Admitting: Anesthesiology

## 2012-11-14 ENCOUNTER — Inpatient Hospital Stay (HOSPITAL_COMMUNITY): Payer: Medicaid Other

## 2012-11-14 ENCOUNTER — Inpatient Hospital Stay (HOSPITAL_COMMUNITY)
Admission: AD | Admit: 2012-11-14 | Discharge: 2012-11-16 | DRG: 775 | Disposition: A | Discharge status: Home / Self Care | Payer: Medicaid Other | Source: Ambulatory Visit | Attending: Obstetrics and Gynecology | Admitting: Obstetrics and Gynecology

## 2012-11-14 ENCOUNTER — Encounter (HOSPITAL_COMMUNITY): Payer: Self-pay | Admitting: *Deleted

## 2012-11-14 ENCOUNTER — Inpatient Hospital Stay (HOSPITAL_COMMUNITY): Payer: Medicaid Other | Admitting: Anesthesiology

## 2012-11-14 DIAGNOSIS — O3433 Maternal care for cervical incompetence, third trimester: Secondary | ICD-10-CM

## 2012-11-14 DIAGNOSIS — O4703 False labor before 37 completed weeks of gestation, third trimester: Secondary | ICD-10-CM

## 2012-11-14 DIAGNOSIS — O3430 Maternal care for cervical incompetence, unspecified trimester: Principal | ICD-10-CM | POA: Diagnosis present

## 2012-11-14 DIAGNOSIS — O4693 Antepartum hemorrhage, unspecified, third trimester: Secondary | ICD-10-CM

## 2012-11-14 LAB — CBC
HCT: 32.9 % — ABNORMAL LOW (ref 36.0–46.0)
Hemoglobin: 11 g/dL — ABNORMAL LOW (ref 12.0–15.0)
Hemoglobin: 10.8 g/dL — ABNORMAL LOW (ref 12.0–15.0)
MCH: 24.6 pg — ABNORMAL LOW (ref 26.0–34.0)
MCHC: 32.8 g/dL (ref 30.0–36.0)
MCV: 74.2 fL — ABNORMAL LOW (ref 78.0–100.0)
MCV: 74.9 fL — ABNORMAL LOW (ref 78.0–100.0)
Platelets: 221 10*3/uL (ref 150–400)
RBC: 4.46 MIL/uL (ref 3.87–5.11)
RDW: 16.5 % — ABNORMAL HIGH (ref 11.5–15.5)
RDW: 16.3 % — ABNORMAL HIGH (ref 11.5–15.5)
WBC: 11 10*3/uL — ABNORMAL HIGH (ref 4.0–10.5)

## 2012-11-14 LAB — MRSA PCR SCREENING: MRSA by PCR: NEGATIVE

## 2012-11-14 LAB — TYPE AND SCREEN: Antibody Screen: NEGATIVE

## 2012-11-14 LAB — RPR: RPR Ser Ql: NONREACTIVE

## 2012-11-14 MED ORDER — DIBUCAINE 1 % RE OINT: 1.0000 "application " | TOPICAL_OINTMENT | RECTAL | Status: DC | PRN

## 2012-11-14 MED ORDER — OXYTOCIN BOLUS FROM INFUSION: 500.0000 mL | INTRAVENOUS | Status: DC

## 2012-11-14 MED ORDER — DIPHENHYDRAMINE HCL 25 MG PO CAPS: 25.0000 mg | ORAL_CAPSULE | Freq: Four times a day (QID) | ORAL | Status: DC | PRN

## 2012-11-14 MED ORDER — ONDANSETRON HCL 4 MG/2ML IJ SOLN: 4.0000 mg | Freq: Four times a day (QID) | INTRAMUSCULAR | Status: DC | PRN

## 2012-11-14 MED ORDER — IBUPROFEN 600 MG PO TABS: 600.0000 mg | ORAL_TABLET | Freq: Four times a day (QID) | ORAL | Status: DC | PRN

## 2012-11-14 MED ORDER — ONDANSETRON HCL 4 MG PO TABS: 4.0000 mg | ORAL_TABLET | ORAL | Status: DC | PRN

## 2012-11-14 MED ORDER — SODIUM CHLORIDE 0.9 % IJ SOLN: 3.0000 mL | INTRAMUSCULAR | Status: DC | PRN

## 2012-11-14 MED ORDER — IBUPROFEN 600 MG PO TABS
600.0000 mg | ORAL_TABLET | Freq: Four times a day (QID) | ORAL | Status: DC
  Administered 2012-11-14 – 2012-11-16 (×7): 600 mg via ORAL
  Filled 2012-11-14 (×7): qty 1

## 2012-11-14 MED ORDER — LIDOCAINE HCL (PF) 1 % IJ SOLN
30.0000 mL | INTRAMUSCULAR | Status: DC | PRN
  Filled 2012-11-14: qty 30

## 2012-11-14 MED ORDER — LIDOCAINE HCL (PF) 1 % IJ SOLN
INTRAMUSCULAR | Status: DC | PRN
  Administered 2012-11-14 (×2): 5 mL

## 2012-11-14 MED ORDER — ACETAMINOPHEN 325 MG PO TABS: 650.0000 mg | ORAL_TABLET | ORAL | Status: DC | PRN

## 2012-11-14 MED ORDER — BUTORPHANOL TARTRATE 1 MG/ML IJ SOLN: 1.0000 mg | INTRAMUSCULAR | Status: DC | PRN

## 2012-11-14 MED ORDER — DIPHENHYDRAMINE HCL 50 MG/ML IJ SOLN: 12.5000 mg | INTRAMUSCULAR | Status: DC | PRN

## 2012-11-14 MED ORDER — OXYTOCIN 40 UNITS IN LACTATED RINGERS INFUSION - SIMPLE MED
62.5000 mL/h | INTRAVENOUS | Status: DC
  Administered 2012-11-14: 62.5 mL/h via INTRAVENOUS

## 2012-11-14 MED ORDER — PHENYLEPHRINE 40 MCG/ML (10ML) SYRINGE FOR IV PUSH (FOR BLOOD PRESSURE SUPPORT)
80.0000 ug | PREFILLED_SYRINGE | INTRAVENOUS | Status: DC | PRN
  Filled 2012-11-14: qty 2

## 2012-11-14 MED ORDER — LACTATED RINGERS IV SOLN
INTRAVENOUS | Status: DC
  Administered 2012-11-14: 10:00:00 via INTRAVENOUS

## 2012-11-14 MED ORDER — SENNOSIDES-DOCUSATE SODIUM 8.6-50 MG PO TABS
2.0000 | ORAL_TABLET | ORAL | Status: DC
  Administered 2012-11-14 – 2012-11-16 (×2): 2 via ORAL
  Filled 2012-11-14 (×2): qty 2

## 2012-11-14 MED ORDER — BENZOCAINE-MENTHOL 20-0.5 % EX AERO: 1.0000 "application " | INHALATION_SPRAY | CUTANEOUS | Status: DC | PRN

## 2012-11-14 MED ORDER — FLEET ENEMA 7-19 GM/118ML RE ENEM: 1.0000 | ENEMA | RECTAL | Status: DC | PRN

## 2012-11-14 MED ORDER — LACTATED RINGERS IV SOLN: 500.0000 mL | INTRAVENOUS | Status: DC | PRN

## 2012-11-14 MED ORDER — EPHEDRINE 5 MG/ML INJ
INTRAVENOUS | Status: AC
  Filled 2012-11-14: qty 4

## 2012-11-14 MED ORDER — CALCIUM CARBONATE ANTACID 500 MG PO CHEW
2.0000 | CHEWABLE_TABLET | ORAL | Status: DC | PRN
  Filled 2012-11-14: qty 2

## 2012-11-14 MED ORDER — TETANUS-DIPHTH-ACELL PERTUSSIS 5-2.5-18.5 LF-MCG/0.5 IM SUSP: 0.5000 mL | Freq: Once | INTRAMUSCULAR | Status: DC

## 2012-11-14 MED ORDER — OXYCODONE-ACETAMINOPHEN 5-325 MG PO TABS: 1.0000 | ORAL_TABLET | ORAL | Status: DC | PRN

## 2012-11-14 MED ORDER — OXYCODONE-ACETAMINOPHEN 5-325 MG PO TABS
1.0000 | ORAL_TABLET | ORAL | Status: DC | PRN
  Administered 2012-11-14 – 2012-11-15 (×3): 2 via ORAL
  Filled 2012-11-14 (×3): qty 2

## 2012-11-14 MED ORDER — LACTATED RINGERS IV SOLN: INTRAVENOUS | Status: DC

## 2012-11-14 MED ORDER — PRENATAL MULTIVITAMIN CH
1.0000 | ORAL_TABLET | Freq: Every day | ORAL | Status: DC
  Administered 2012-11-15 – 2012-11-16 (×2): 1 via ORAL
  Filled 2012-11-14 (×2): qty 1

## 2012-11-14 MED ORDER — EPHEDRINE 5 MG/ML INJ
10.0000 mg | INTRAVENOUS | Status: DC | PRN
  Filled 2012-11-14: qty 2

## 2012-11-14 MED ORDER — FENTANYL 2.5 MCG/ML BUPIVACAINE 1/10 % EPIDURAL INFUSION (WH - ANES)
INTRAMUSCULAR | Status: AC
  Administered 2012-11-14: 14 mL/h via EPIDURAL
  Filled 2012-11-14: qty 125

## 2012-11-14 MED ORDER — PHENYLEPHRINE 40 MCG/ML (10ML) SYRINGE FOR IV PUSH (FOR BLOOD PRESSURE SUPPORT)
PREFILLED_SYRINGE | INTRAVENOUS | Status: AC
  Filled 2012-11-14: qty 5

## 2012-11-14 MED ORDER — ZOLPIDEM TARTRATE 5 MG PO TABS: 5.0000 mg | ORAL_TABLET | Freq: Every evening | ORAL | Status: DC | PRN

## 2012-11-14 MED ORDER — SIMETHICONE 80 MG PO CHEW: 80.0000 mg | CHEWABLE_TABLET | ORAL | Status: DC | PRN

## 2012-11-14 MED ORDER — PRENATAL MULTIVITAMIN CH
1.0000 | ORAL_TABLET | Freq: Every day | ORAL | Status: DC
  Filled 2012-11-14 (×2): qty 1

## 2012-11-14 MED ORDER — WITCH HAZEL-GLYCERIN EX PADS: 1.0000 "application " | MEDICATED_PAD | CUTANEOUS | Status: DC | PRN

## 2012-11-14 MED ORDER — SODIUM CHLORIDE 0.9 % IV SOLN
250.0000 mL | INTRAVENOUS | Status: DC | PRN
  Administered 2012-11-14: 250 mL via INTRAVENOUS

## 2012-11-14 MED ORDER — DOCUSATE SODIUM 100 MG PO CAPS
100.0000 mg | ORAL_CAPSULE | Freq: Every day | ORAL | Status: DC
  Filled 2012-11-14 (×2): qty 1

## 2012-11-14 MED ORDER — LACTATED RINGERS IV SOLN: 500.0000 mL | Freq: Once | INTRAVENOUS | Status: DC

## 2012-11-14 MED ORDER — CITRIC ACID-SODIUM CITRATE 334-500 MG/5ML PO SOLN
30.0000 mL | ORAL | Status: DC | PRN
  Administered 2012-11-14: 30 mL via ORAL

## 2012-11-14 MED ORDER — ONDANSETRON HCL 4 MG/2ML IJ SOLN: 4.0000 mg | INTRAMUSCULAR | Status: DC | PRN

## 2012-11-14 MED ORDER — FENTANYL 2.5 MCG/ML BUPIVACAINE 1/10 % EPIDURAL INFUSION (WH - ANES)
14.0000 mL/h | INTRAMUSCULAR | Status: DC | PRN
  Administered 2012-11-14 (×2): 14 mL/h via EPIDURAL
  Filled 2012-11-14: qty 125

## 2012-11-14 MED ORDER — LANOLIN HYDROUS EX OINT: TOPICAL_OINTMENT | CUTANEOUS | Status: DC | PRN

## 2012-11-14 MED ORDER — OXYTOCIN 40 UNITS IN LACTATED RINGERS INFUSION - SIMPLE MED
62.5000 mL/h | INTRAVENOUS | Status: DC
  Filled 2012-11-14: qty 1000

## 2012-11-14 MED ORDER — NIFEDIPINE 10 MG PO CAPS
20.0000 mg | ORAL_CAPSULE | Freq: Four times a day (QID) | ORAL | Status: DC | PRN
  Filled 2012-11-14: qty 2

## 2012-11-14 MED ORDER — CITRIC ACID-SODIUM CITRATE 334-500 MG/5ML PO SOLN
30.0000 mL | ORAL | Status: DC | PRN
  Filled 2012-11-14: qty 15

## 2012-11-14 MED ORDER — SODIUM CHLORIDE 0.9 % IJ SOLN: 3.0000 mL | Freq: Two times a day (BID) | INTRAMUSCULAR | Status: DC

## 2012-11-14 NOTE — Progress Notes (Signed)
See Previous H&P and Discharge summary 32yo, G1 at [redacted]W[redacted]D, admitted over the weekend for preterm ctx, had cerclage removed.  Pt stabilized and was sent home yesterday.  Pt presents this am by EMS with possible ROM.  Eval by nurse with possible +fern, NP unable to confirm ROM by speculum exam due to bloody discharge.  Pt having irregular ctx, but had BM in the bed, VE 4 cm by RN.  Bedside ultrasound with AFI of 12.  Will admit for observation due to irreg ctx and advanced dilation, watch for evidence of ROM or significant bleeding or labor.  Will continue Procardia prn.

## 2012-11-14 NOTE — Progress Notes (Signed)
Patient ID: Priscilla Powell, female   DOB: 25-Apr-1980, 32 y.o.   MRN: [id number] Pt very uncomfortable with contractions She has continued to have substantial bleeding passing some clots FHR overall reassuring US showed no evidence of abruption Cervix 90/4-5/-1 with BBOW Pt appears in labor and VB is more than normal with cervical bloody show I do not feel any suture remaining in cervix, but it does have a tight band at internal os Will allow epidural and follow progress, reexamine cervix after epidural

## 2012-11-14 NOTE — Progress Notes (Signed)
Patient ID: Priscilla Powell, female   DOB: February 23, 1980, 32 y.o.   MRN: [id number] Pt received epidural and more comfortable FHR reassuring Cervix 5+/c/0 AROM clear fluid VB improved Follow progress

## 2012-11-14 NOTE — MAU Note (Signed)
Pt arrived via EMS for leaking bloody fluid since 0630 and having contractions.

## 2012-11-14 NOTE — Anesthesia Procedure Notes (Signed)
Epidural Patient location during procedure: OB Start time: 11/14/2012 10:27 AM  Staffing Anesthesiologist: Brayton Caves Performed by: anesthesiologist   Preanesthetic Checklist Completed: patient identified, site marked, surgical consent, pre-op evaluation, timeout performed, IV checked, risks and benefits discussed and monitors and equipment checked  Epidural Patient position: sitting Prep: site prepped and draped and DuraPrep Patient monitoring: continuous pulse ox and blood pressure Approach: midline Injection technique: LOR air and LOR saline  Needle:  Needle type: Tuohy  Needle gauge: 17 G Needle length: 9 cm and 9 Needle insertion depth: 9 cm Catheter type: closed end flexible Catheter size: 19 Gauge Catheter at skin depth: 15 cm Test dose: negative  Assessment Events: blood not aspirated, injection not painful, no injection resistance, negative IV test and no paresthesia  Additional Notes Patient identified.  Risk benefits discussed including failed block, incomplete pain control, headache, nerve damage, paralysis, blood pressure changes, nausea, vomiting, reactions to medication both toxic or allergic, and postpartum back pain.  Patient expressed understanding and wished to proceed.  All questions were answered.  Sterile technique used throughout procedure and epidural site dressed with sterile barrier dressing. No paresthesia or other complications noted.The patient did not experience any signs of intravascular injection such as tinnitus or metallic taste in mouth nor signs of intrathecal spread such as rapid motor block. Please see nursing notes for vital signs.

## 2012-11-14 NOTE — MAU Provider Note (Signed)
  History     CSN: [id number]  Arrival date and time: 11/14/12 4098   None     Chief Complaint  Patient presents with  . Rupture of Membranes   HPI  Priscilla Powell is a 32 y.o. G1P0 at [redacted]w[redacted]d who presents today via EMS for SROM. She states that she felt a large gush of fluid at 0630, and has continued to feel a trickle. She also reports vaginal bleeding. She states that she has been bleeding, and was recently admitted for bleeding. She was DC yesterday. She has had BMZ.   Past Medical History  Diagnosis Date  . Vertigo   . Headache(784.0)   . BV (bacterial vaginosis)   . UTI (lower urinary tract infection)   . Trichimoniasis   . Bilateral external ear infections   . Cervical incompetence affecting management of pregnancy, antepartum 11/10/2012  . Vaginal bleeding in pregnancy 11/10/2012  . Preterm uterine contractions, antepartum 11/10/2012    Past Surgical History  Procedure Laterality Date  . No past surgeries    . Cryoablation    . Cervical cerclage N/A 08/24/2012    Procedure: CERCLAGE CERVICAL;  Surgeon: Oliver Pila, MD;  Location: [facility];  Service: Gynecology;  Laterality: N/A;    Family History  Problem Relation Age of Onset  . Diabetes Mother     History  Substance Use Topics  . Smoking status: Never Smoker   . Smokeless tobacco: Never Used  . Alcohol Use: No    Allergies: No Known Allergies  Prescriptions prior to admission  Medication Sig Dispense Refill  . NIFEdipine (PROCARDIA) 20 MG capsule Take 1 capsule (20 mg total) by mouth 4 (four) times daily as needed (for contractions).  30 capsule  2  . Prenatal Vit-Fe Fumarate-FA (PRENATAL MULTIVITAMIN) TABS Take 1 tablet by mouth at bedtime.      . progesterone 200 MG SUPP Place 200 mg vaginally at bedtime.        ROS Physical Exam   Blood pressure 144/77, pulse 114, temperature 99 F (37.2 C), temperature source Oral, resp. rate 22, height 5' (1.524 m), weight 124.739 kg (275 lb), last  menstrual period 03/20/2012.  Physical Exam  Nursing note and vitals reviewed. Constitutional: She is oriented to person, place, and time. She appears well-developed and well-nourished. No distress.  Cardiovascular: Normal rate.   Respiratory: Effort normal.  GI: Soft. There is no tenderness.  Genitourinary:   Perineum: appears to have clear fluid  Vagina: moderate amount of blood and clots. Unable to determine if it is fluid 2/2 to blood Cervix: visually about 3 cm Uterus: AGA  Neurological: She is alert and oriented to person, place, and time.  Skin: Skin is warm and dry.  Psychiatric: She has a normal mood and affect.   FHT: 145, moderate with accels, no decels Toco: no UCs seen, patient is breathing through "contractions".  MAU Course  Procedures  Crist Fat +  1191: D/W Dr. Jackelyn Knife: plan to get an [place] for AFI and continue to monitor for now. Will call with an update.   Assessment and Plan  0800: Care turned over to S. Chase Picket, NP.   Tawnya Crook 11/14/2012, 7:23 AM

## 2012-11-14 NOTE — Anesthesia Preprocedure Evaluation (Signed)
Anesthesia Evaluation  Patient identified by MRN, date of birth, ID band Patient awake    Reviewed: Allergy & Precautions, H&P , Patient's Chart, lab work & pertinent test results  Airway Mallampati: III TM Distance: >3 FB Neck ROM: full    Dental no notable dental hx.    Pulmonary neg pulmonary ROS, shortness of breath,  breath sounds clear to auscultation  Pulmonary exam normal       Cardiovascular hypertension, negative cardio ROS  Rhythm:regular Rate:Normal     Neuro/Psych  Headaches, negative neurological ROS  negative psych ROS   GI/Hepatic negative GI ROS, Neg liver ROS,   Endo/Other  negative endocrine ROSMorbid obesity  Renal/GU negative Renal ROS     Musculoskeletal   Abdominal   Peds  Hematology negative hematology ROS (+)   Anesthesia Other Findings   Reproductive/Obstetrics (+) Pregnancy                           Anesthesia Physical Anesthesia Plan  ASA: III  Anesthesia Plan: Epidural   Post-op Pain Management:    Induction:   Airway Management Planned:   Additional Equipment:   Intra-op Plan:   Post-operative Plan:   Informed Consent: I have reviewed the patients History and Physical, chart, labs and discussed the procedure including the risks, benefits and alternatives for the proposed anesthesia with the patient or authorized representative who has indicated his/her understanding and acceptance.     Plan Discussed with:   Anesthesia Plan Comments:         Anesthesia Quick Evaluation

## 2012-11-14 NOTE — Progress Notes (Signed)
Pt had BM in bed, SVE done.

## 2012-11-15 LAB — CBC
HCT: 26.1 % — ABNORMAL LOW (ref 36.0–46.0)
Hemoglobin: 8.5 g/dL — ABNORMAL LOW (ref 12.0–15.0)
MCH: 24.4 pg — ABNORMAL LOW (ref 26.0–34.0)
MCHC: 32.6 g/dL (ref 30.0–36.0)
MCV: 75 fL — ABNORMAL LOW (ref 78.0–100.0)
RBC: 3.48 MIL/uL — ABNORMAL LOW (ref 3.87–5.11)

## 2012-11-15 NOTE — Lactation Note (Signed)
This note was copied from the chart of Priscilla Chani Ghanem. Lactation Consultation Note Initial consultation with mom at bedside in NICU. Mom holding baby. Discussed basics, answered questions. WIC referral for a DEBP made at mom's request.  Will review pump and hand expression when mom is in her own room. Enc mom to call for assistance if needed.   Patient Name: Priscilla Powell [id number] Date: 11/15/2012 Reason for consult: Initial assessment;Infant < 6lbs;NICU baby   Maternal Data Formula Feeding for Exclusion: Yes Reason for exclusion: Admission to Intensive Care Unit (ICU) post-partum Has patient been taught Hand Expression?: No (See note) Does the patient have breastfeeding experience prior to this delivery?: No  Feeding Feeding Type: Formula Nipple Type: Slow - flow Length of feed: 15 min  LATCH Score/Interventions                      Lactation Tools Discussed/Used     Consult Status Consult Status: Follow-up Follow-up type: In-patient    Octavio Manns [facility] 11/15/2012, 10:39 AM

## 2012-11-15 NOTE — Anesthesia Postprocedure Evaluation (Signed)
Anesthesia Post Note  Patient: Priscilla Powell  Procedure(s) Performed: * No procedures listed *  Anesthesia type: Epidural  Patient location: Mother/Baby  Post pain: Pain level controlled  Post assessment: Post-op Vital signs reviewed  Last Vitals:  Filed Vitals:   11/15/12 0641  BP: 98/47  Pulse: 74  Temp: 36.6 C  Resp: 18    Post vital signs: Reviewed  Level of consciousness:alert  Complications: No apparent anesthesia complications

## 2012-11-15 NOTE — Lactation Note (Signed)
This note was copied from the chart of Priscilla Powell. Lactation Consultation Note Follow up consultation at baby's bedside in NICU to assist mom to attempt to breast feed. Assisted mom to place baby STS in cross cradle using pillow supports. Demonstrated to mom how to hand express and how to hold baby. Mom return demonstration. Small glisten of colostrum hand expressed and put on baby's lips. Baby sound asleep, did not root or wake to latch on. Mom then fed baby a bottle with the assistance of RN.  Reinforced the importance of STS and practicing at the breast, explained to mom that baby is still very young, and reassured her that we will continue to help her with STS and nuzzling, until the baby is ready to latch. Inst mom to continue pumping at least 8 times per day, at least once at night, followed by 3 to 4 minutes of hand expression. Mom very receptive to teaching and eager to breast feed and/or provide breast milk for baby.  Mom anticipates discharge tomorrow, and already has an appointment with WIC to get her pump, at 2:00.  Will continue to follow up with this mom when she is at bedside.  Patient Name: Priscilla Powell [id number] Date: 11/15/2012 Reason for consult: Follow-up assessment;NICU baby;Infant < 6lbs   Maternal Data    Feeding Feeding Type: Formula Nipple Type: Slow - flow Length of feed: 10 min  LATCH Score/Interventions Latch: Too sleepy or reluctant, no latch achieved, no sucking elicited.                    Lactation Tools Discussed/Used     Consult Status Consult Status: Follow-up Follow-up type: In-patient    Octavio Manns [facility] 11/15/2012, 2:40 PM

## 2012-11-15 NOTE — Progress Notes (Signed)
Ur chart review completed.  

## 2012-11-15 NOTE — Progress Notes (Signed)
Post Partum Day 1 Subjective: no complaints, up ad lib and tolerating PO  Objective: Blood pressure 98/47, pulse 74, temperature 97.9 F (36.6 C), temperature source Oral, resp. rate 18, height 5' (1.524 m), weight 124.739 kg (275 lb), last menstrual period 03/20/2012, SpO2 100.00%, unknown if currently breastfeeding.  Physical Exam:  General: alert and cooperative Lochia: appropriate Uterine Fundus: firm    Recent Labs  11/14/12 1420 11/15/12 0515  HGB 10.8* 8.5*  HCT 32.9* 26.1*    Assessment/Plan: Plan for discharge tomorrow Baby doing well in NICU   LOS: 1 day   Merideth Bosque W 11/15/2012, 9:12 AM

## 2012-11-16 MED ORDER — OXYCODONE-ACETAMINOPHEN 5-325 MG PO TABS
1.0000 | ORAL_TABLET | ORAL | Status: DC | PRN
Start: 2012-11-16 — End: 2013-03-31

## 2012-11-16 MED ORDER — IBUPROFEN 600 MG PO TABS: 600.0000 mg | ORAL_TABLET | Freq: Four times a day (QID) | ORAL | Status: DC

## 2012-11-16 NOTE — Progress Notes (Signed)
Teaching complete   Ambulated  Out    

## 2012-11-16 NOTE — Discharge Summary (Signed)
Obstetric Discharge Summary Reason for Admission: onset of labor Prenatal Procedures: none Intrapartum Procedures: spontaneous vaginal delivery Postpartum Procedures: none Complications-Operative and Postpartum: none Hemoglobin  Date Value Range Status  11/15/2012 8.5* 12.0 - 15.0 g/dL Final     REPEATED TO VERIFY     DELTA CHECK NOTED     HCT  Date Value Range Status  11/15/2012 26.1* 36.0 - 46.0 % Final    Physical Exam:  General: alert and cooperative Lochia: appropriate Uterine Fundus: firm    Discharge Diagnoses: Preterm pregnancy delivered  Discharge Information: Date: 11/16/2012 Activity: pelvic rest Diet: routine Medications: Ibuprofen and Percocet Condition: improved Instructions: refer to practice specific booklet Discharge to: home Follow-up Information   Follow up with Oliver Pila, MD In 6 weeks. (postpartum)    Specialty:  Obstetrics and Gynecology   Contact information:   [address], [address] [place] [address] [phone]       Newborn Data: Live born female  Birth Weight: 4 lb 11.6 oz (2143 g) APGAR: 9, 9 Baby in NICU.  Carolos Fecher W 11/16/2012, 11:10 AM

## 2012-11-16 NOTE — Progress Notes (Signed)
Post Partum Day 2 Subjective: up ad lib and tolerating PO  Appropriately tearful re: d/c without baby  Objective: Blood pressure 126/85, pulse 98, temperature 97.9 F (36.6 C), temperature source Oral, resp. rate 18, height 5' (1.524 m), weight 124.739 kg (275 lb), last menstrual period 03/20/2012, SpO2 100.00%, unknown if currently breastfeeding.  Physical Exam:  General: alert and cooperative Lochia: appropriate Uterine Fundus: firm    Recent Labs  11/14/12 1420 11/15/12 0515  HGB 10.8* 8.5*  HCT 32.9* 26.1*    Assessment/Plan: Discharge home Motrin and percocet   LOS: 2 days   Shiraz Bastyr W 11/16/2012, 11:08 AM

## 2012-11-17 NOTE — Progress Notes (Signed)
Clinical Social Work Department PSYCHOSOCIAL ASSESSMENT - MATERNAL/CHILD 11/16/2012  Patient:  Priscilla Powell,Priscilla Powell  Account Number:  [id number]  Admit Date:  11/14/2012  Childs Name:   Priscilla Powell    Clinical Social Worker:  Muna Demers, LCSW   Date/Time:  11/16/2012 11:00 AM  Date Referred:  11/16/2012   Referral source  RN  NICU     Referred reason  Other - See comment  NICU   Other referral source:    I:  FAMILY / HOME ENVIRONMENT Child's legal guardian:  PARENT  Guardian - Name Guardian - Age Guardian - Address  Priscilla Powell [address] [place], [place] [address]  FOB is in [place]     Other household support members/support persons Name Relationship DOB   BROTHER 35   SISTER 28   Other support:   MGM    II  PSYCHOSOCIAL DATA Information Source:  Family Interview  Financial and Community Resources Employment:   MOB works at [employer]   Financial resources:  Medicaid If Medicaid - County:  [place] Other  Food Stamps  WIC   School / Grade:   Maternity Care Coordinator / Child Services Coordination / Early Interventions:   [facility]  Cultural issues impacting care:   None stated    III  STRENGTHS Strengths  Adequate Resources  Compliance with medical plan  Other - See comment  Supportive family/friends   Strength comment:  Pediatric follow up will be at [facility].   IV  RISK FACTORS AND CURRENT PROBLEMS Current Problem:  None   Risk Factor & Current Problem Patient Issue Family Issue Risk Factor / Current Problem Comment   N N     V  SOCIAL WORK ASSESSMENT CSW met with MOB and MGM in MOB's third floor room/302 to introduce myself and complete assessment due to NICU admission and referral for transportation needs.  MOB was quiet, but pleasant and seems to possibly still be in shock.  She reports doing well for the most part, but having periods of crying and she feels this is unexplainable.  She began to tear up as she  said this.  CSW validated her feelings and encouraged her to cry/process her emotions.  CSW informed her that it is normal to feel emotional after delivery and especially on the day of discharge when her baby has to remain in the hospital.  This appeared to ease her somewhat.  She became more open to talking with CSW and was engaged as we continued to discuss signs and symptoms of PPD and what is normal vs. what is more concerning.  CSW encouraged her to call CSW or her doctor if she has emotional concerns at any time and she agreed.  She reports having baby's basic needs met at home, but not expecting him early or to be this little, so she is in need of some preemie clothes and diapers.  CSW made referral to [facility].  MOB also states she does not have a car, but her mother, who lives in [place], and her siblings will be able to transport her some in order to visit the baby.  She requested assistance with gas, but CSW does not feel able to provide this assistance since she is in [place] and it sounds as though her baby will not be hospitalized for a long time.  CSW asked if she would be able to use bus passes and she states she does not know what bus to take.    CSW provided her with the [employer] phone number and gave her a bus pass and asked her to please call CSW if she uses it and wants more.  She was appreciative and agreed.  She states she is excited about the baby and although she would prefer to have her own place, she plans to stay with her brother and sister for as long as it takes to find something suitable and affortable for her and her baby.  She states she will have 6 weeks off for maternity leave.  She states no further questions, concerns or needs at this time.  CSW gave contact information and asked her to call CSW any time.  She and MGM thanked CSW for the visit.      VI SOCIAL WORK PLAN Social Work Plan  Psychosocial Support/Ongoing Assessment of  Needs   Type of pt/family education:   PPD signs and symptoms  Ongoing support from NICU CSW   If child protective services report - county:   If child protective services report - date:   Information/referral to community resources comment:   [facility]   Other social work plan:    

## 2013-03-30 ENCOUNTER — Encounter (HOSPITAL_COMMUNITY): Payer: Self-pay | Admitting: Emergency Medicine

## 2013-03-30 DIAGNOSIS — R1012 Left upper quadrant pain: Secondary | ICD-10-CM | POA: Insufficient documentation

## 2013-03-30 DIAGNOSIS — Z79899 Other long term (current) drug therapy: Secondary | ICD-10-CM | POA: Insufficient documentation

## 2013-03-30 DIAGNOSIS — R42 Dizziness and giddiness: Secondary | ICD-10-CM | POA: Insufficient documentation

## 2013-03-30 DIAGNOSIS — Z87440 Personal history of urinary (tract) infections: Secondary | ICD-10-CM | POA: Insufficient documentation

## 2013-03-30 DIAGNOSIS — R51 Headache: Secondary | ICD-10-CM | POA: Insufficient documentation

## 2013-03-30 DIAGNOSIS — B9689 Other specified bacterial agents as the cause of diseases classified elsewhere: Secondary | ICD-10-CM | POA: Insufficient documentation

## 2013-03-30 DIAGNOSIS — A599 Trichomoniasis, unspecified: Secondary | ICD-10-CM | POA: Insufficient documentation

## 2013-03-30 DIAGNOSIS — A499 Bacterial infection, unspecified: Secondary | ICD-10-CM | POA: Insufficient documentation

## 2013-03-30 DIAGNOSIS — H60399 Other infective otitis externa, unspecified ear: Secondary | ICD-10-CM | POA: Insufficient documentation

## 2013-03-30 DIAGNOSIS — Z8742 Personal history of other diseases of the female genital tract: Secondary | ICD-10-CM | POA: Insufficient documentation

## 2013-03-30 DIAGNOSIS — N760 Acute vaginitis: Secondary | ICD-10-CM | POA: Insufficient documentation

## 2013-03-30 LAB — CBC WITH DIFFERENTIAL/PLATELET
BASOS PCT: 0 % (ref 0–1)
Basophils Absolute: 0 10*3/uL (ref 0.0–0.1)
Eosinophils Absolute: 0.1 10*3/uL (ref 0.0–0.7)
Eosinophils Relative: 1 % (ref 0–5)
HCT: 35.7 % — ABNORMAL LOW (ref 36.0–46.0)
HEMOGLOBIN: 11.8 g/dL — AB (ref 12.0–15.0)
LYMPHS ABS: 2.1 10*3/uL (ref 0.7–4.0)
Lymphocytes Relative: 36 % (ref 12–46)
MCH: 25.6 pg — ABNORMAL LOW (ref 26.0–34.0)
MCHC: 33.1 g/dL (ref 30.0–36.0)
MCV: 77.4 fL — ABNORMAL LOW (ref 78.0–100.0)
Monocytes Absolute: 0.4 10*3/uL (ref 0.1–1.0)
Monocytes Relative: 7 % (ref 3–12)
NEUTROS ABS: 3.2 10*3/uL (ref 1.7–7.7)
Neutrophils Relative %: 56 % (ref 43–77)
Platelets: 220 10*3/uL (ref 150–400)
RBC: 4.61 MIL/uL (ref 3.87–5.11)
RDW: 17.1 % — ABNORMAL HIGH (ref 11.5–15.5)
WBC: 5.8 10*3/uL (ref 4.0–10.5)

## 2013-03-30 LAB — URINALYSIS, ROUTINE W REFLEX MICROSCOPIC
BILIRUBIN URINE: NEGATIVE
GLUCOSE, UA: NEGATIVE mg/dL
HGB URINE DIPSTICK: NEGATIVE
Ketones, ur: NEGATIVE mg/dL
Nitrite: NEGATIVE
Protein, ur: NEGATIVE mg/dL
SPECIFIC GRAVITY, URINE: 1.028 (ref 1.005–1.030)
Urobilinogen, UA: 0.2 mg/dL (ref 0.0–1.0)
pH: 5 (ref 5.0–8.0)

## 2013-03-30 LAB — URINE MICROSCOPIC-ADD ON

## 2013-03-30 LAB — PREGNANCY, URINE: Preg Test, Ur: NEGATIVE

## 2013-03-30 NOTE — ED Notes (Signed)
The pt has had lower abd pain with nv for several days intermittently.  lmp  Feb 17th

## 2013-03-31 ENCOUNTER — Emergency Department (HOSPITAL_COMMUNITY)
Admission: EM | Admit: 2013-03-31 | Discharge: 2013-03-31 | Disposition: A | Discharge status: Home / Self Care | Payer: Medicaid Other | Attending: Emergency Medicine | Admitting: Emergency Medicine

## 2013-03-31 ENCOUNTER — Emergency Department (HOSPITAL_COMMUNITY): Payer: Medicaid Other

## 2013-03-31 DIAGNOSIS — R109 Unspecified abdominal pain: Secondary | ICD-10-CM

## 2013-03-31 LAB — COMPREHENSIVE METABOLIC PANEL
ALBUMIN: 3.9 g/dL (ref 3.5–5.2)
ALK PHOS: 63 U/L (ref 39–117)
ALT: 13 U/L (ref 0–35)
AST: 17 U/L (ref 0–37)
BUN: 10 mg/dL (ref 6–23)
CHLORIDE: 101 meq/L (ref 96–112)
CO2: 20 mEq/L (ref 19–32)
Calcium: 9.4 mg/dL (ref 8.4–10.5)
Creatinine, Ser: 0.59 mg/dL (ref 0.50–1.10)
GFR calc Af Amer: >90 mL/min (ref >90)
GFR calc non Af Amer: >90 mL/min (ref >90)
Glucose, Bld: 119 mg/dL — ABNORMAL HIGH (ref 70–99)
POTASSIUM: 4.1 meq/L (ref 3.7–5.3)
Sodium: 141 mEq/L (ref 137–147)
Total Bilirubin: 0.3 mg/dL (ref 0.3–1.2)
Total Protein: 7.4 g/dL (ref 6.0–8.3)

## 2013-03-31 LAB — LIPASE, BLOOD: LIPASE: 56 U/L (ref 11–59)

## 2013-03-31 MED ORDER — LANSOPRAZOLE 30 MG PO CPDR: 30.0000 mg | DELAYED_RELEASE_CAPSULE | Freq: Every day | ORAL | Status: DC

## 2013-03-31 NOTE — ED Notes (Signed)
Alert, NAD, calm, interactive, no changes, VSS, to US, rates pain 4/10.

## 2013-03-31 NOTE — ED Notes (Signed)
Pt alert, NAD, calm, interactive, resps e/u, speaking in clear complete sentences, skin W&D, c/o periumbilical abd pain, comes and goes, LMP 2/17, "have had pain since then", also reports nd, and dizziness. (Denies: vomiting, urinary or vaginal sx, bleeding, fever or other sx), last ate 2200, last BM PTA (diarrhea, watery brown), "took hydrocodone Sunday and Tuesday".

## 2013-03-31 NOTE — ED Notes (Signed)
Pt alert, NAD, calm, resting, (denies: pain, nausea or other sx), "feel better", updated about wait.

## 2013-03-31 NOTE — ED Provider Notes (Signed)
CSN: [id number]     Arrival date & time 03/30/13  2159 History   First MD Initiated Contact with Patient 03/31/13 0046     Chief Complaint  Patient presents with  . Abdominal Pain     (Consider location/radiation/quality/duration/timing/severity/associated sxs/prior Treatment) HPI Patient presents with episodic left upper quadrant abdominal pain for the past month. She says associated with food. She has no nausea or vomiting. She denies any diarrhea or constipation. Since being in the emergency department the abdominal pain has subsided. She denies fevers or chills. She denies any urinary symptoms or vaginal bleeding or discharge. She's not had previous abdominal surgery Past Medical History  Diagnosis Date  . Vertigo   . Headache(784.0)   . BV (bacterial vaginosis)   . UTI (lower urinary tract infection)   . Trichimoniasis   . Bilateral external ear infections   . Cervical incompetence affecting management of pregnancy, antepartum 11/10/2012  . Vaginal bleeding in pregnancy 11/10/2012  . Preterm uterine contractions, antepartum 11/10/2012   Past Surgical History  Procedure Laterality Date  . No past surgeries    . Cryoablation    . Cervical cerclage N/A 08/24/2012    Procedure: CERCLAGE CERVICAL;  Surgeon: Oliver PilaKathy W Richardson, MD;  Location: [facility];  Service: Gynecology;  Laterality: N/A;   Family History  Problem Relation Age of Onset  . Diabetes Mother    History  Substance Use Topics  . Smoking status: Never Smoker   . Smokeless tobacco: Never Used  . Alcohol Use: No   OB History   Grav Para Term Preterm Abortions TAB SAB Ect Mult Living   1 1  1      1      Review of Systems  Constitutional: Negative for fever and chills.  Respiratory: Negative for cough and shortness of breath.   Cardiovascular: Negative for chest pain, palpitations and leg swelling.  Gastrointestinal: Positive for abdominal pain. Negative for nausea, vomiting, diarrhea and constipation.   Genitourinary: Negative for dysuria, hematuria, flank pain, vaginal bleeding, vaginal discharge and pelvic pain.  Musculoskeletal: Negative for back pain, myalgias, neck pain and neck stiffness.  Skin: Negative for pallor, rash and wound.  Neurological: Negative for dizziness, weakness, numbness and headaches.  All other systems reviewed and are negative.      Allergies  Review of patient's allergies indicates no known allergies.  Home Medications   Current Outpatient Rx  Name  Route  Sig  Dispense  Refill  . norgestimate-ethinyl estradiol (ORTHO-CYCLEN,SPRINTEC,PREVIFEM) 0.25-35 MG-MCG tablet   Oral   Take 1 tablet by mouth daily.         . sertraline (ZOLOFT) 50 MG tablet   Oral   Take 50 mg by mouth daily.         . lansoprazole (PREVACID) 30 MG capsule   Oral   Take 1 capsule (30 mg total) by mouth daily at 12 noon.   30 capsule   0    BP 103/53  Pulse 73  Temp(Src) 98.7 F (37.1 C)  Resp 22  Ht 5\' 5"  (1.651 m)  Wt 255 lb (115.667 kg)  BMI 42.43 kg/m2  SpO2 100%  LMP 03/11/2013 Physical Exam  Nursing note and vitals reviewed. Constitutional: She is oriented to person, place, and time. She appears well-developed and well-nourished. No distress.  HENT:  Head: Normocephalic and atraumatic.  Mouth/Throat: Oropharynx is clear and moist.  Eyes: EOM are normal. Pupils are equal, round, and reactive to light.  Neck: Normal range of motion.  Neck supple.  Cardiovascular: Normal rate and regular rhythm.   Pulmonary/Chest: Effort normal and breath sounds normal. No respiratory distress. She has no wheezes. She has no rales. She exhibits no tenderness.  Abdominal: Soft. Bowel sounds are normal. She exhibits no distension and no mass. There is tenderness (very mild left upper quadrant tenderness to palpation.). There is no rebound and no guarding.  Musculoskeletal: Normal range of motion. She exhibits no edema and no tenderness.  No CVA tenderness bilaterally.   Neurological: She is alert and oriented to person, place, and time.  Moves all extremities without deficit. Sensation grossly intact.  Skin: Skin is warm and dry. No rash noted. No erythema.  Psychiatric: She has a normal mood and affect. Her behavior is normal.    ED Course  Procedures (including critical care time) Labs Review Labs Reviewed  CBC WITH DIFFERENTIAL - Abnormal; Notable for the following:    Hemoglobin 11.8 (*)    HCT 35.7 (*)    MCV 77.4 (*)    MCH 25.6 (*)    RDW 17.1 (*)    All other components within normal limits  COMPREHENSIVE METABOLIC PANEL - Abnormal; Notable for the following:    Glucose, Bld 119 (*)    All other components within normal limits  URINALYSIS, ROUTINE W REFLEX MICROSCOPIC - Abnormal; Notable for the following:    APPearance CLOUDY (*)    Leukocytes, UA SMALL (*)    All other components within normal limits  URINE MICROSCOPIC-ADD ON - Abnormal; Notable for the following:    Crystals CA OXALATE CRYSTALS (*)    All other components within normal limits  LIPASE, BLOOD  PREGNANCY, URINE   Imaging Review US Abdomen Complete  03/31/2013   CLINICAL DATA:  Abdominal pain  EXAM: ULTRASOUND ABDOMEN COMPLETE  COMPARISON:  None.  FINDINGS: Gallbladder:  No gallstones or wall thickening visualized. No sonographic Murphy sign noted.  Common bile duct:  Diameter: 3.9 mm  Liver:  No focal lesion identified. Within normal limits in parenchymal echogenicity.  IVC:  No abnormality visualized.  Pancreas:  Limited  Spleen:  Size and appearance within normal limits.  Right Kidney:  Length: 13.9 cm. Echogenicity within normal limits. No mass or hydronephrosis visualized.  Left Kidney:  Length: 13.0 cm. Echogenicity within normal limits. No mass or hydronephrosis visualized.  Abdominal aorta:  No aneurysm visualized.  Other findings:  Image quality limited by patient size and inability to hold breath  IMPRESSION: No acute abnormality detected.   Electronically Signed    By: Marlan Palau M.D.   On: 03/31/2013 06:23     EKG Interpretation None      MDM   Final diagnoses:  Abdominal pain    Normal ultrasound. Patient's symptoms improved with treatment. I suspect gastritis. Will start the patient on PPI and advise diet modifications. Return precautions have been given and the patient's voice understanding.    Loren Racer, MD 04/01/13 [id number]

## 2013-03-31 NOTE — ED Notes (Signed)
Pt sleeping, arousable to voice. States, "feels better". Pt updated, family in w/r updated. Pending disposition.

## 2013-03-31 NOTE — Discharge Instructions (Signed)
Abdominal Pain, Women °Abdominal (stomach, pelvic, or belly) pain can be caused by many things. It is important to tell your doctor: °· The location of the pain. °· Does it come and go or is it present all the time? °· Are there things that start the pain (eating certain foods, exercise)? °· Are there other symptoms associated with the pain (fever, nausea, vomiting, diarrhea)? °All of this is helpful to know when trying to find the cause of the pain. °CAUSES  °· Stomach: virus or bacteria infection, or ulcer. °· Intestine: appendicitis (inflamed appendix), regional ileitis (Crohn's disease), ulcerative colitis (inflamed colon), irritable bowel syndrome, diverticulitis (inflamed diverticulum of the colon), or cancer of the stomach or intestine. °· Gallbladder disease or stones in the gallbladder. °· Kidney disease, kidney stones, or infection. °· Pancreas infection or cancer. °· Fibromyalgia (pain disorder). °· Diseases of the female organs: °· Uterus: fibroid (non-cancerous) tumors or infection. °· Fallopian tubes: infection or tubal pregnancy. °· Ovary: cysts or tumors. °· Pelvic adhesions (scar tissue). °· Endometriosis (uterus lining tissue growing in the pelvis and on the pelvic organs). °· Pelvic congestion syndrome (female organs filling up with blood just before the menstrual period). °· Pain with the menstrual period. °· Pain with ovulation (producing an egg). °· Pain with an IUD (intrauterine device, birth control) in the uterus. °· Cancer of the female organs. °· Functional pain (pain not caused by a disease, may improve without treatment). °· Psychological pain. °· Depression. °DIAGNOSIS  °Your doctor will decide the seriousness of your pain by doing an examination. °· Blood tests. °· X-rays. °· Ultrasound. °· CT scan (computed tomography, special type of X-ray). °· MRI (magnetic resonance imaging). °· Cultures, for infection. °· Barium enema (dye inserted in the large intestine, to better view it with  X-rays). °· Colonoscopy (looking in intestine with a lighted tube). °· Laparoscopy (minor surgery, looking in abdomen with a lighted tube). °· Major abdominal exploratory surgery (looking in abdomen with a large incision). °TREATMENT  °The treatment will depend on the cause of the pain.  °· Many cases can be observed and treated at home. °· Over-the-counter medicines recommended by your caregiver. °· Prescription medicine. °· Antibiotics, for infection. °· Birth control pills, for painful periods or for ovulation pain. °· Hormone treatment, for endometriosis. °· Nerve blocking injections. °· Physical therapy. °· Antidepressants. °· Counseling with a psychologist or psychiatrist. °· Minor or major surgery. °HOME CARE INSTRUCTIONS  °· Do not take laxatives, unless directed by your caregiver. °· Take over-the-counter pain medicine only if ordered by your caregiver. Do not take aspirin because it can cause an upset stomach or bleeding. °· Try a clear liquid diet (broth or water) as ordered by your caregiver. Slowly move to a bland diet, as tolerated, if the pain is related to the stomach or intestine. °· Have a thermometer and take your temperature several times a day, and record it. °· Bed rest and sleep, if it helps the pain. °· Avoid sexual intercourse, if it causes pain. °· Avoid stressful situations. °· Keep your follow-up appointments and tests, as your caregiver orders. °· If the pain does not go away with medicine or surgery, you may try: °· Acupuncture. °· Relaxation exercises (yoga, meditation). °· Group therapy. °· Counseling. °SEEK MEDICAL CARE IF:  °· You notice certain foods cause stomach pain. °· Your home care treatment is not helping your pain. °· You need stronger pain medicine. °· You want your IUD removed. °· You feel faint or   lightheaded. °· You develop nausea and vomiting. °· You develop a rash. °· You are having side effects or an allergy to your medicine. °SEEK IMMEDIATE MEDICAL CARE IF:  °· Your  pain does not go away or gets worse. °· You have a fever. °· Your pain is felt only in portions of the abdomen. The right side could possibly be appendicitis. The left lower portion of the abdomen could be colitis or diverticulitis. °· You are passing blood in your stools (bright red or black tarry stools, with or without vomiting). °· You have blood in your urine. °· You develop chills, with or without a fever. °· You pass out. °MAKE SURE YOU:  °· Understand these instructions. °· Will watch your condition. °· Will get help right away if you are not doing well or get worse. °Document Released: 11/01/2006 Document Revised: 03/29/2011 Document Reviewed: 11/21/2008 °ExitCare® Patient Information ©2014 [employer]. ° °Gastritis, Adult °Gastritis is soreness and swelling (inflammation) of the lining of the stomach. Gastritis can develop as a sudden onset (acute) or long-term (chronic) condition. If gastritis is not treated, it can lead to stomach bleeding and ulcers. °CAUSES  °Gastritis occurs when the stomach lining is weak or damaged. Digestive juices from the stomach then inflame the weakened stomach lining. The stomach lining may be weak or damaged due to viral or bacterial infections. One common bacterial infection is the Helicobacter pylori infection. Gastritis can also result from excessive alcohol consumption, taking certain medicines, or having too much acid in the stomach.  °SYMPTOMS  °In some cases, there are no symptoms. When symptoms are present, they may include: °· Pain or a burning sensation in the upper abdomen. °· Nausea. °· Vomiting. °· An uncomfortable feeling of fullness after eating. °DIAGNOSIS  °Your caregiver may suspect you have gastritis based on your symptoms and a physical exam. To determine the cause of your gastritis, your caregiver may perform the following: °· Blood or stool tests to check for the H pylori bacterium. °· Gastroscopy. A thin, flexible tube (endoscope) is passed down the  esophagus and into the stomach. The endoscope has a light and camera on the end. Your caregiver uses the endoscope to view the inside of the stomach. °· Taking a tissue sample (biopsy) from the stomach to examine under a microscope. °TREATMENT  °Depending on the cause of your gastritis, medicines may be prescribed. If you have a bacterial infection, such as an H pylori infection, antibiotics may be given. If your gastritis is caused by too much acid in the stomach, H2 blockers or antacids may be given. Your caregiver may recommend that you stop taking aspirin, ibuprofen, or other nonsteroidal anti-inflammatory drugs (NSAIDs). °HOME CARE INSTRUCTIONS °· Only take over-the-counter or prescription medicines as directed by your caregiver. °· If you were given antibiotic medicines, take them as directed. Finish them even if you start to feel better. °· Drink enough fluids to keep your urine clear or pale yellow. °· Avoid foods and drinks that make your symptoms worse, such as: °· Caffeine or alcoholic drinks. °· Chocolate. °· Peppermint or mint flavorings. °· Garlic and onions. °· Spicy foods. °· Citrus fruits, such as oranges, lemons, or limes. °· Tomato-based foods such as sauce, chili, salsa, and pizza. °· Fried and fatty foods. °· Eat small, frequent meals instead of large meals. °SEEK IMMEDIATE MEDICAL CARE IF:  °· You have black or dark red stools. °· You vomit blood or material that looks like coffee grounds. °· You are unable   to keep fluids down. °· Your abdominal pain gets worse. °· You have a fever. °· You do not feel better after 1 week. °· You have any other questions or concerns. °MAKE SURE YOU: °· Understand these instructions. °· Will watch your condition. °· Will get help right away if you are not doing well or get worse. °Document Released: 12/29/2000 Document Revised: 07/06/2011 Document Reviewed: 02/17/2011 °ExitCare® Patient Information ©2014 [employer]. ° °

## 2013-09-28 ENCOUNTER — Emergency Department (HOSPITAL_COMMUNITY)
Admission: EM | Admit: 2013-09-28 | Discharge: 2013-09-28 | Disposition: A | Discharge status: Home / Self Care | Payer: Medicaid Other | Source: Home / Self Care | Attending: Family Medicine | Admitting: Family Medicine

## 2013-09-28 ENCOUNTER — Encounter (HOSPITAL_COMMUNITY): Payer: Self-pay | Admitting: Emergency Medicine

## 2013-09-28 DIAGNOSIS — J069 Acute upper respiratory infection, unspecified: Secondary | ICD-10-CM

## 2013-09-28 MED ORDER — CETIRIZINE HCL 10 MG PO TABS: 10.0000 mg | ORAL_TABLET | Freq: Every day | ORAL | Status: DC

## 2013-09-28 MED ORDER — IPRATROPIUM BROMIDE 0.06 % NA SOLN: 2.0000 | Freq: Four times a day (QID) | NASAL | Status: DC

## 2013-09-28 NOTE — ED Provider Notes (Addendum)
CSN: [id number]     Arrival date & time 09/28/13  1546 History   First MD Initiated Contact with Patient 09/28/13 1601     Chief Complaint  Patient presents with  . URI   (Consider location/radiation/quality/duration/timing/severity/associated sxs/prior Treatment) Patient is a 33 y.o. female presenting with URI. The history is provided by the patient.  URI Presenting symptoms: congestion, cough, ear pain and rhinorrhea   Presenting symptoms: no fever   Severity:  Mild Onset quality:  Gradual Duration:  2 days Chronicity:  New Ineffective treatments: advil  Associated symptoms: no myalgias   Risk factors: sick contacts     Past Medical History  Diagnosis Date  . Vertigo   . Headache(784.0)   . BV (bacterial vaginosis)   . UTI (lower urinary tract infection)   . Trichimoniasis   . Bilateral external ear infections   . Cervical incompetence affecting management of pregnancy, antepartum 11/10/2012  . Vaginal bleeding in pregnancy 11/10/2012  . Preterm uterine contractions, antepartum 11/10/2012   Past Surgical History  Procedure Laterality Date  . No past surgeries    . Cryoablation    . Cervical cerclage N/A 08/24/2012    Procedure: CERCLAGE CERVICAL;  Surgeon: Oliver Pila, MD;  Location: [facility];  Service: Gynecology;  Laterality: N/A;   Family History  Problem Relation Age of Onset  . Diabetes Mother    History  Substance Use Topics  . Smoking status: Never Smoker   . Smokeless tobacco: Never Used  . Alcohol Use: No   OB History   Grav Para Term Preterm Abortions TAB SAB Ect Mult Living   Review of Systems  Constitutional: Negative.  Negative for fever.  HENT: Positive for congestion, ear pain, postnasal drip and rhinorrhea.   Eyes: Negative.   Respiratory: Positive for cough.   Cardiovascular: Negative.   Gastrointestinal: Negative.   Genitourinary: Negative.   Musculoskeletal: Negative for myalgias.    Allergies  Review of  patient's allergies indicates no known allergies.  Home Medications   Prior to Admission medications   Medication Sig Start Date End Date Taking? Authorizing Provider  cetirizine (ZYRTEC) 10 MG tablet Take 1 tablet (10 mg total) by mouth daily. One tab daily for allergies 09/28/13   Linna Hoff, MD  ipratropium (ATROVENT) 0.06 % nasal spray Place 2 sprays into both nostrils 4 (four) times daily. 09/28/13   Linna Hoff, MD  lansoprazole (PREVACID) 30 MG capsule Take 1 capsule (30 mg total) by mouth daily at 12 noon. 03/31/13   Loren Racer, MD  norgestimate-ethinyl estradiol (ORTHO-CYCLEN,SPRINTEC,PREVIFEM) 0.25-35 MG-MCG tablet Take 1 tablet by mouth daily.    Historical Provider, MD  sertraline (ZOLOFT) 50 MG tablet Take 50 mg by mouth daily.    Historical Provider, MD   BP 130/77  Pulse 119  Temp(Src) 100.2 F (37.9 C) (Oral)  Resp 20  SpO2 96%  LMP 09/18/2013 Physical Exam  Nursing note and vitals reviewed. Constitutional: She is oriented to person, place, and time. She appears well-developed and well-nourished. No distress.  HENT:  Head: Normocephalic.  Right Ear: External ear normal.  Left Ear: External ear normal.  Nose: Mucosal edema and rhinorrhea present.  Mouth/Throat: Oropharynx is clear and moist.  Neck: Normal range of motion. Neck supple.  Cardiovascular: Regular rhythm, normal heart sounds and intact distal pulses.   Pulmonary/Chest: Effort normal and breath sounds normal.  Abdominal: Soft. Bowel sounds are  normal. She exhibits no distension. There is no tenderness.  Neurological: She is alert and oriented to person, place, and time.  Skin: Skin is warm and dry.    ED Course  Procedures (including critical care time) Labs Review Labs Reviewed - No data to display  Imaging Review No results found.   MDM   1. URI (upper respiratory infection)        Linna Hoff, MD 09/28/13 1623  Linna Hoff, MD 09/28/13 [phone]

## 2013-09-28 NOTE — ED Notes (Signed)
C/o as if she is coming down w a cold , achy, ear pain

## 2013-09-28 NOTE — Discharge Instructions (Signed)
Drink plenty of fluids as discussed, use medicine as prescribed, and mucinex or delsym for cough. Return or see your doctor if further problems °

## 2013-11-19 ENCOUNTER — Encounter (HOSPITAL_COMMUNITY): Payer: Self-pay | Admitting: Emergency Medicine

## 2014-01-19 ENCOUNTER — Emergency Department (HOSPITAL_COMMUNITY): Payer: Medicaid Other

## 2014-01-19 ENCOUNTER — Encounter (HOSPITAL_COMMUNITY): Payer: Self-pay | Admitting: Emergency Medicine

## 2014-01-19 ENCOUNTER — Emergency Department (HOSPITAL_COMMUNITY)
Admission: EM | Admit: 2014-01-19 | Discharge: 2014-01-19 | Disposition: A | Discharge status: Home / Self Care | Payer: Medicaid Other | Attending: Emergency Medicine | Admitting: Emergency Medicine

## 2014-01-19 DIAGNOSIS — Z87440 Personal history of urinary (tract) infections: Secondary | ICD-10-CM | POA: Diagnosis not present

## 2014-01-19 DIAGNOSIS — Y998 Other external cause status: Secondary | ICD-10-CM | POA: Diagnosis not present

## 2014-01-19 DIAGNOSIS — Z791 Long term (current) use of non-steroidal anti-inflammatories (NSAID): Secondary | ICD-10-CM | POA: Insufficient documentation

## 2014-01-19 DIAGNOSIS — Z87448 Personal history of other diseases of urinary system: Secondary | ICD-10-CM | POA: Insufficient documentation

## 2014-01-19 DIAGNOSIS — Z79899 Other long term (current) drug therapy: Secondary | ICD-10-CM | POA: Diagnosis not present

## 2014-01-19 DIAGNOSIS — W010XXA Fall on same level from slipping, tripping and stumbling without subsequent striking against object, initial encounter: Secondary | ICD-10-CM | POA: Diagnosis not present

## 2014-01-19 DIAGNOSIS — S93402A Sprain of unspecified ligament of left ankle, initial encounter: Secondary | ICD-10-CM | POA: Insufficient documentation

## 2014-01-19 DIAGNOSIS — S99912A Unspecified injury of left ankle, initial encounter: Secondary | ICD-10-CM | POA: Diagnosis present

## 2014-01-19 DIAGNOSIS — Y9389 Activity, other specified: Secondary | ICD-10-CM | POA: Insufficient documentation

## 2014-01-19 DIAGNOSIS — Z8619 Personal history of other infectious and parasitic diseases: Secondary | ICD-10-CM | POA: Diagnosis not present

## 2014-01-19 DIAGNOSIS — Y92091 Bathroom in other non-institutional residence as the place of occurrence of the external cause: Secondary | ICD-10-CM | POA: Insufficient documentation

## 2014-01-19 MED ORDER — IBUPROFEN 800 MG PO TABS
800.0000 mg | ORAL_TABLET | Freq: Once | ORAL | Status: AC
  Administered 2014-01-19: 800 mg via ORAL
  Filled 2014-01-19: qty 1

## 2014-01-19 MED ORDER — MELOXICAM 7.5 MG PO TABS: 15.0000 mg | ORAL_TABLET | Freq: Every day | ORAL | Status: DC

## 2014-01-19 NOTE — Discharge Instructions (Signed)
Ankle Sprain °An ankle sprain is an injury to the strong, fibrous tissues (ligaments) that hold the bones of your ankle joint together.  °CAUSES °An ankle sprain is usually caused by a fall or by twisting your ankle. Ankle sprains most commonly occur when you step on the outer edge of your foot, and your ankle turns inward. People who participate in sports are more prone to these types of injuries.  °SYMPTOMS  °· Pain in your ankle. The pain may be present at rest or only when you are trying to stand or walk. °· Swelling. °· Bruising. Bruising may develop immediately or within 1 to 2 days after your injury. °· Difficulty standing or walking, particularly when turning corners or changing directions. °DIAGNOSIS  °Your caregiver will ask you details about your injury and perform a physical exam of your ankle to determine if you have an ankle sprain. During the physical exam, your caregiver will press on and apply pressure to specific areas of your foot and ankle. Your caregiver will try to move your ankle in certain ways. An X-ray exam may be done to be sure a bone was not broken or a ligament did not separate from one of the bones in your ankle (avulsion fracture).  °TREATMENT  °Certain types of braces can help stabilize your ankle. Your caregiver can make a recommendation for this. Your caregiver may recommend the use of medicine for pain. If your sprain is severe, your caregiver may refer you to a surgeon who helps to restore function to parts of your skeletal system (orthopedist) or a physical therapist. °HOME CARE INSTRUCTIONS  °· Apply ice to your injury for 1-2 days or as directed by your caregiver. Applying ice helps to reduce inflammation and pain. °· Put ice in a plastic bag. °· Place a towel between your skin and the bag. °· Leave the ice on for 15-20 minutes at a time, every 2 hours while you are awake. °· Only take over-the-counter or prescription medicines for pain, discomfort, or fever as directed by  your caregiver. °· Elevate your injured ankle above the level of your heart as much as possible for 2-3 days. °· If your caregiver recommends crutches, use them as instructed. Gradually put weight on the affected ankle. Continue to use crutches or a cane until you can walk without feeling pain in your ankle. °· If you have a plaster splint, wear the splint as directed by your caregiver. Do not rest it on anything harder than a pillow for the first 24 hours. Do not put weight on it. Do not get it wet. You may take it off to take a shower or bath. °· You may have been given an elastic bandage to wear around your ankle to provide support. If the elastic bandage is too tight (you have numbness or tingling in your foot or your foot becomes cold and blue), adjust the bandage to make it comfortable. °· If you have an air splint, you may blow more air into it or let air out to make it more comfortable. You may take your splint off at night and before taking a shower or bath. Wiggle your toes in the splint several times per day to decrease swelling. °SEEK MEDICAL CARE IF:  °· You have rapidly increasing bruising or swelling. °· Your toes feel extremely cold or you lose feeling in your foot. °· Your pain is not relieved with medicine. °SEEK IMMEDIATE MEDICAL CARE IF: °· Your toes are numb or blue. °·   You have severe pain that is increasing. °MAKE SURE YOU:  °· Understand these instructions. °· Will watch your condition. °· Will get help right away if you are not doing well or get worse. °Document Released: 01/04/2005 Document Revised: 09/29/2011 Document Reviewed: 01/16/2011 °ExitCare® Patient Information ©2015 [employer]. This information is not intended to replace advice given to you by your health care provider. Make sure you discuss any questions you have with your health care provider. ° ° ° °RICE: Routine Care for Injuries °The routine care of many injuries includes Rest, Ice, Compression, and Elevation (RICE). °HOME  CARE INSTRUCTIONS °· Rest is needed to allow your body to heal. Routine activities can usually be resumed when comfortable. Injured tendons and bones can take up to 6 weeks to heal. Tendons are the cord-like structures that attach muscle to bone. °· Ice following an injury helps keep the swelling down and reduces pain. °¨ Put ice in a plastic bag. °¨ Place a towel between your skin and the bag. °¨ Leave the ice on for 15-20 minutes, 3-4 times a day, or as directed by your health care provider. Do this while awake, for the first 24 to 48 hours. After that, continue as directed by your caregiver. °· Compression helps keep swelling down. It also gives support and helps with discomfort. If an elastic bandage has been applied, it should be removed and reapplied every 3 to 4 hours. It should not be applied tightly, but firmly enough to keep swelling down. Watch fingers or toes for swelling, bluish discoloration, coldness, numbness, or excessive pain. If any of these problems occur, remove the bandage and reapply loosely. Contact your caregiver if these problems continue. °· Elevation helps reduce swelling and decreases pain. With extremities, such as the arms, hands, legs, and feet, the injured area should be placed near or above the level of the heart, if possible. °SEEK IMMEDIATE MEDICAL CARE IF: °· You have persistent pain and swelling. °· You develop redness, numbness, or unexpected weakness. °· Your symptoms are getting worse rather than improving after several days. °These symptoms may indicate that further evaluation or further X-rays are needed. Sometimes, X-rays may not show a small broken bone (fracture) until 1 week or 10 days later. Make a follow-up appointment with your caregiver. Ask when your X-ray results will be ready. Make sure you get your X-ray results. °Document Released: 04/18/2000 Document Revised: 01/09/2013 Document Reviewed: 06/05/2010 °ExitCare® Patient Information ©2015 [employer]. This  information is not intended to replace advice given to you by your health care provider. Make sure you discuss any questions you have with your health care provider. ° °

## 2014-01-19 NOTE — ED Provider Notes (Signed)
CSN: [id number]     Arrival date & time 01/19/14  2004 History  This chart was scribed for non-physician practitioner working with Dr. Arby Barrette by Elveria Rising, ED Scribe. This patient was seen in room WTR6/WTR6 and the patient's care was started at 10:03 PM.   Chief Complaint  Patient presents with  . Ankle Injury   The history is provided by the patient. No language interpreter was used.   HPI Comments: Priscilla Powell is a 34 y.o. female who presents to the Emergency Department with a left ankle injury after falling in the bathroom at [facility]. Patient reports that when she was attempting to sit on the toilet, she slipped on a puddle of water, inverting her ankle. Patient reports throbbing pain radiating from the dorsum of her foot to the medial aspect. Patient reports exacerbated pain with walking and bearing weight.   Past Medical History  Diagnosis Date  . Vertigo   . Headache(784.0)   . BV (bacterial vaginosis)   . UTI (lower urinary tract infection)   . Trichimoniasis   . Bilateral external ear infections   . Cervical incompetence affecting management of pregnancy, antepartum 11/10/2012  . Vaginal bleeding in pregnancy 11/10/2012  . Preterm uterine contractions, antepartum 11/10/2012   Past Surgical History  Procedure Laterality Date  . No past surgeries    . Cryoablation    . Cervical cerclage N/A 08/24/2012    Procedure: CERCLAGE CERVICAL;  Surgeon: Oliver Pila, MD;  Location: [facility];  Service: Gynecology;  Laterality: N/A;   Family History  Problem Relation Age of Onset  . Diabetes Mother    History  Substance Use Topics  . Smoking status: Never Smoker   . Smokeless tobacco: Never Used  . Alcohol Use: No   OB History    Gravida Para Term Preterm AB TAB SAB Ectopic Multiple Living   Review of Systems  Constitutional: Negative for fever and chills.  Musculoskeletal: Positive for arthralgias. Negative for joint swelling.   Neurological: Negative for weakness and numbness.    Allergies  Review of patient's allergies indicates no known allergies.  Home Medications   Prior to Admission medications   Medication Sig Start Date End Date Taking? Authorizing Provider  cetirizine (ZYRTEC) 10 MG tablet Take 1 tablet (10 mg total) by mouth daily. One tab daily for allergies Patient not taking: Reported on 01/19/2014 09/28/13   Linna Hoff, MD  ipratropium (ATROVENT) 0.06 % nasal spray Place 2 sprays into both nostrils 4 (four) times daily. Patient not taking: Reported on 01/19/2014 09/28/13   Linna Hoff, MD  lansoprazole (PREVACID) 30 MG capsule Take 1 capsule (30 mg total) by mouth daily at 12 noon. Patient not taking: Reported on 01/19/2014 03/31/13   Loren Racer, MD  meloxicam (MOBIC) 7.5 MG tablet Take 2 tablets (15 mg total) by mouth daily. 01/19/14   Antony Madura, PA-C   BP 138/105 mmHg  Pulse 97  Temp(Src) 97.7 F (36.5 C) (Oral)  Resp 16  SpO2 100%  LMP 01/10/2014   Physical Exam  Constitutional: She is oriented to person, place, and time. She appears well-developed and well-nourished. No distress.  Nontoxic/nonseptic appearing  HENT:  Head: Normocephalic and atraumatic.  Eyes: Conjunctivae and EOM are normal. No scleral icterus.  Neck: Normal range of motion. Neck supple. No tracheal deviation present.  Cardiovascular: Normal rate, regular rhythm and intact distal pulses.  DP and PT pulses 2+ in left lower extremity  Pulmonary/Chest: Effort normal. No respiratory distress.  Musculoskeletal: Normal range of motion.       Left ankle: She exhibits normal range of motion, no swelling, no deformity, no laceration and normal pulse. Tenderness. Achilles tendon normal.       Left lower leg: Normal.       Left foot: There is tenderness. There is normal range of motion, no bony tenderness, no swelling, normal capillary refill and no crepitus.       Feet:  Neurological: She is alert and oriented to person,  place, and time. She exhibits normal muscle tone. Coordination normal.  Patient ambulates with steady, mildly antalgic gait. Sensation to light touch intact.  Skin: Skin is warm and dry. No rash noted. She is not diaphoretic. No erythema. No pallor.  Psychiatric: She has a normal mood and affect. Her behavior is normal.  Nursing note and vitals reviewed.   ED Course  Procedures (including critical care time)  COORDINATION OF CARE: 9:18 PM- Discussed treatment plan with patient at bedside and patient agreed to plan.   Labs Review Labs Reviewed - No data to display  Imaging Review Dg Ankle Complete Left  01/19/2014   CLINICAL DATA:  Ankle injury today with pain in the lateral and top of the left ankle.  EXAM: LEFT ANKLE COMPLETE - 3+ VIEW  COMPARISON:  None.  FINDINGS: There is no evidence of fracture, dislocation, or joint effusion. There is no evidence of arthropathy or other focal bone abnormality. There is small plantar calcaneal spur. Generalized soft tissue swelling is noted around the ankle.  IMPRESSION: No acute fracture or dislocation. Generalized soft tissue swelling of the ankle.   Electronically Signed   By: Sherian Rein M.D.   On: 01/19/2014 20:54     EKG Interpretation None      MDM   Final diagnoses:  Left ankle sprain, initial encounter    34 year old female presents to the emergency department for left ankle pain after a slip and rolling of her ankle at [facility]. No head trauma or loss of consciousness. Patient neurovascularly intact. Imaging negative. Symptoms consistent with ankle sprain. Patient to be treated as outpatient with RICE and Mobic. ASO provided as well as orthopedic referral. Return precautions discussed and provided. Patient agreeable to plan with no unaddressed concerns.  I personally performed the services described in this documentation, which was scribed in my presence. The recorded information has been reviewed and is accurate.   Filed Vitals:    01/19/14 2013  BP: 138/105  Pulse: 97  Temp: 97.7 F (36.5 C)  TempSrc: Oral  Resp: 16  SpO2: 100%     Antony Madura, PA-C 01/19/14 2209  Arby Barrette, MD 01/20/14 [phone]

## 2014-01-19 NOTE — ED Notes (Signed)
Patient was using the bathroom at [facility] and slipped on water and overextended her left ankle. Has not taken any pain medications today. Able to walk and bear minimal weight on ankle. Patient also c/o back pain from "when I was holding myself trying not to fall." No other complaints/concerns. A&Ox4.

## 2014-01-19 NOTE — ED Notes (Signed)
Pt ambulatory to room with steady gait

## 2014-03-12 ENCOUNTER — Emergency Department (HOSPITAL_COMMUNITY): Payer: Medicaid Other

## 2014-03-12 ENCOUNTER — Encounter (HOSPITAL_COMMUNITY): Payer: Self-pay | Admitting: Physical Medicine and Rehabilitation

## 2014-03-12 ENCOUNTER — Emergency Department (HOSPITAL_COMMUNITY)
Admission: EM | Admit: 2014-03-12 | Discharge: 2014-03-12 | Disposition: A | Discharge status: Home / Self Care | Payer: Medicaid Other | Attending: Emergency Medicine | Admitting: Emergency Medicine

## 2014-03-12 DIAGNOSIS — R0981 Nasal congestion: Secondary | ICD-10-CM | POA: Diagnosis present

## 2014-03-12 DIAGNOSIS — R197 Diarrhea, unspecified: Secondary | ICD-10-CM | POA: Diagnosis not present

## 2014-03-12 DIAGNOSIS — R112 Nausea with vomiting, unspecified: Secondary | ICD-10-CM | POA: Diagnosis not present

## 2014-03-12 DIAGNOSIS — M791 Myalgia: Secondary | ICD-10-CM | POA: Diagnosis not present

## 2014-03-12 DIAGNOSIS — J069 Acute upper respiratory infection, unspecified: Secondary | ICD-10-CM | POA: Diagnosis not present

## 2014-03-12 DIAGNOSIS — Z8669 Personal history of other diseases of the nervous system and sense organs: Secondary | ICD-10-CM | POA: Diagnosis not present

## 2014-03-12 DIAGNOSIS — Z87440 Personal history of urinary (tract) infections: Secondary | ICD-10-CM | POA: Insufficient documentation

## 2014-03-12 DIAGNOSIS — B9789 Other viral agents as the cause of diseases classified elsewhere: Secondary | ICD-10-CM

## 2014-03-12 DIAGNOSIS — Z8742 Personal history of other diseases of the female genital tract: Secondary | ICD-10-CM | POA: Diagnosis not present

## 2014-03-12 DIAGNOSIS — Z79899 Other long term (current) drug therapy: Secondary | ICD-10-CM | POA: Diagnosis not present

## 2014-03-12 DIAGNOSIS — Z8619 Personal history of other infectious and parasitic diseases: Secondary | ICD-10-CM | POA: Insufficient documentation

## 2014-03-12 DIAGNOSIS — Z792 Long term (current) use of antibiotics: Secondary | ICD-10-CM | POA: Insufficient documentation

## 2014-03-12 MED ORDER — GUAIFENESIN 100 MG/5ML PO SYRP: 100.0000 mg | ORAL_SOLUTION | ORAL | Status: DC | PRN

## 2014-03-12 MED ORDER — IBUPROFEN 400 MG PO TABS
800.0000 mg | ORAL_TABLET | Freq: Once | ORAL | Status: AC
  Administered 2014-03-12: 800 mg via ORAL
  Filled 2014-03-12: qty 2

## 2014-03-12 MED ORDER — PSEUDOEPHEDRINE HCL 30 MG PO TABS: 30.0000 mg | ORAL_TABLET | ORAL | Status: DC | PRN

## 2014-03-12 MED ORDER — ALBUTEROL SULFATE HFA 108 (90 BASE) MCG/ACT IN AERS
2.0000 | INHALATION_SPRAY | Freq: Once | RESPIRATORY_TRACT | Status: AC
  Administered 2014-03-12: 2 via RESPIRATORY_TRACT
  Filled 2014-03-12: qty 6.7

## 2014-03-12 NOTE — ED Notes (Addendum)
Pt reports body aches and fever since yesterday, cough since last Friday.

## 2014-03-12 NOTE — Discharge Instructions (Signed)
Robitussin for cough. Sudafed for congestion. Use inhaler 2 puffs every 4 hrs. Rest. Drink plenty of fluids. Follow up with your doctor or return if worsening.    Upper Respiratory Infection, Adult An upper respiratory infection (URI) is also sometimes known as the common cold. The upper respiratory tract includes the nose, sinuses, throat, trachea, and bronchi. Bronchi are the airways leading to the lungs. Most people improve within 1 week, but symptoms can last up to 2 weeks. A residual cough may last even longer.  CAUSES Many different viruses can infect the tissues lining the upper respiratory tract. The tissues become irritated and inflamed and often become very moist. Mucus production is also common. A cold is contagious. You can easily spread the virus to others by oral contact. This includes kissing, sharing a glass, coughing, or sneezing. Touching your mouth or nose and then touching a surface, which is then touched by another person, can also spread the virus. SYMPTOMS  Symptoms typically develop 1 to 3 days after you come in contact with a cold virus. Symptoms vary from person to person. They may include:  Runny nose.  Sneezing.  Nasal congestion.  Sinus irritation.  Sore throat.  Loss of voice (laryngitis).  Cough.  Fatigue.  Muscle aches.  Loss of appetite.  Headache.  Low-grade fever. DIAGNOSIS  You might diagnose your own cold based on familiar symptoms, since most people get a cold 2 to 3 times a year. Your caregiver can confirm this based on your exam. Most importantly, your caregiver can check that your symptoms are not due to another disease such as strep throat, sinusitis, pneumonia, asthma, or epiglottitis. Blood tests, throat tests, and X-rays are not necessary to diagnose a common cold, but they may sometimes be helpful in excluding other more serious diseases. Your caregiver will decide if any further tests are required. RISKS AND COMPLICATIONS  You may be  at risk for a more severe case of the common cold if you smoke cigarettes, have chronic heart disease (such as heart failure) or lung disease (such as asthma), or if you have a weakened immune system. The very young and very old are also at risk for more serious infections. Bacterial sinusitis, middle ear infections, and bacterial pneumonia can complicate the common cold. The common cold can worsen asthma and chronic obstructive pulmonary disease (COPD). Sometimes, these complications can require emergency medical care and may be life-threatening. PREVENTION  The best way to protect against getting a cold is to practice good hygiene. Avoid oral or hand contact with people with cold symptoms. Wash your hands often if contact occurs. There is no clear evidence that vitamin C, vitamin E, echinacea, or exercise reduces the chance of developing a cold. However, it is always recommended to get plenty of rest and practice good nutrition. TREATMENT  Treatment is directed at relieving symptoms. There is no cure. Antibiotics are not effective, because the infection is caused by a virus, not by bacteria. Treatment may include:  Increased fluid intake. Sports drinks offer valuable electrolytes, sugars, and fluids.  Breathing heated mist or steam (vaporizer or shower).  Eating chicken soup or other clear broths, and maintaining good nutrition.  Getting plenty of rest.  Using gargles or lozenges for comfort.  Controlling fevers with ibuprofen or acetaminophen as directed by your caregiver.  Increasing usage of your inhaler if you have asthma. Zinc gel and zinc lozenges, taken in the first 24 hours of the common cold, can shorten the duration and lessen  the severity of symptoms. Pain medicines may help with fever, muscle aches, and throat pain. A variety of non-prescription medicines are available to treat congestion and runny nose. Your caregiver can make recommendations and may suggest nasal or lung inhalers  for other symptoms.  HOME CARE INSTRUCTIONS   Only take over-the-counter or prescription medicines for pain, discomfort, or fever as directed by your caregiver.  Use a warm mist humidifier or inhale steam from a shower to increase air moisture. This may keep secretions moist and make it easier to breathe.  Drink enough water and fluids to keep your urine clear or pale yellow.  Rest as needed.  Return to work when your temperature has returned to normal or as your caregiver advises. You may need to stay home longer to avoid infecting others. You can also use a face mask and careful hand washing to prevent spread of the virus. SEEK MEDICAL CARE IF:   After the first few days, you feel you are getting worse rather than better.  You need your caregiver's advice about medicines to control symptoms.  You develop chills, worsening shortness of breath, or brown or red sputum. These may be signs of pneumonia.  You develop yellow or brown nasal discharge or pain in the face, especially when you bend forward. These may be signs of sinusitis.  You develop a fever, swollen neck glands, pain with swallowing, or white areas in the back of your throat. These may be signs of strep throat. SEEK IMMEDIATE MEDICAL CARE IF:   You have a fever.  You develop severe or persistent headache, ear pain, sinus pain, or chest pain.  You develop wheezing, a prolonged cough, cough up blood, or have a change in your usual mucus (if you have chronic lung disease).  You develop sore muscles or a stiff neck. Document Released: 06/30/2000 Document Revised: 03/29/2011 Document Reviewed: 04/11/2013 [facility] LLCExitCare Patient Information 2015 [place]ExitCare, [place]. This information is not intended to replace advice given to you by your health care provider. Make sure you discuss any questions you have with your health care provider.

## 2014-03-12 NOTE — ED Provider Notes (Signed)
CSN: [id number]     Arrival date & time 03/12/14  1722 History  This chart was scribed for non-physician practitioner Lottie Musselatyana A Dyane Broberg, PA-C working with Glynn OctaveStephen Rancour, MD by Conchita ParisNadim Abuhashem, ED Scribe. This patient was seen in TR05C/TR05C and the patient's care was started at 5:55 PM.   Chief Complaint  Patient presents with  . Nasal Congestion  . Fever  . Chills  . Generalized Body Aches   Patient is a 34 y.o. female presenting with fever. The history is provided by the patient. No language interpreter was used.  Fever Associated symptoms: chills, congestion, cough, diarrhea, myalgias, nausea, rhinorrhea and vomiting   Associated symptoms: no dysuria and no sore throat     HPI Comments: Priscilla Powell is a 34 y.o. female who presents to the Emergency Department complaining of generalized body aches and a fever, onset last night. She has had chills since yesterday at 4:00 PM. She has taken 4 doses of tylenol two at 1:00 PM today and two at  6:00 PM yesterday. As well as an OTC congestion relief medication. Pt also has a dry cough which began Friday and rhinorrhea onset yesterday. She has left rib pain that radiates down to her legs. She states it "feels like something is weighing her down". She complains of some nausea, vomiting, diarrhea and congestion. Pt has not had the flu shot. She has been drinking water and sprite today. Does not smoke. She denies dysuria.  Past Medical History  Diagnosis Date  . Vertigo   . Headache(784.0)   . BV (bacterial vaginosis)   . UTI (lower urinary tract infection)   . Trichimoniasis   . Bilateral external ear infections   . Cervical incompetence affecting management of pregnancy, antepartum 11/10/2012  . Vaginal bleeding in pregnancy 11/10/2012  . Preterm uterine contractions, antepartum 11/10/2012   Past Surgical History  Procedure Laterality Date  . No past surgeries    . Cryoablation    . Cervical cerclage N/A 08/24/2012    Procedure:  CERCLAGE CERVICAL;  Surgeon: Oliver PilaKathy W Richardson, MD;  Location: [facility];  Service: Gynecology;  Laterality: N/A;   Family History  Problem Relation Age of Onset  . Diabetes Mother    History  Substance Use Topics  . Smoking status: Never Smoker   . Smokeless tobacco: Never Used  . Alcohol Use: No   OB History    Gravida Para Term Preterm AB TAB SAB Ectopic Multiple Living   1 1  1      1      Review of Systems  Constitutional: Positive for fever and chills.  HENT: Positive for congestion and rhinorrhea. Negative for sore throat.   Respiratory: Positive for cough.   Gastrointestinal: Positive for nausea, vomiting and diarrhea. Negative for abdominal pain.  Genitourinary: Negative for dysuria.  Musculoskeletal: Positive for myalgias.      Allergies  Review of patient's allergies indicates no known allergies.  Home Medications   Prior to Admission medications   Medication Sig Start Date End Date Taking? Authorizing Provider  cetirizine (ZYRTEC) 10 MG tablet Take 1 tablet (10 mg total) by mouth daily. One tab daily for allergies Patient not taking: Reported on 01/19/2014 09/28/13   Linna HoffJames D Kindl, MD  ipratropium (ATROVENT) 0.06 % nasal spray Place 2 sprays into both nostrils 4 (four) times daily. Patient not taking: Reported on 01/19/2014 09/28/13   Linna HoffJames D Kindl, MD  lansoprazole (PREVACID) 30 MG capsule Take 1 capsule (30 mg total) by mouth daily  at 12 noon. Patient not taking: Reported on 01/19/2014 03/31/13   Loren Racer, MD  meloxicam (MOBIC) 7.5 MG tablet Take 2 tablets (15 mg total) by mouth daily. 01/19/14   Antony Madura, PA-C   BP 122/81 mmHg  Pulse 109  Temp(Src) 100 F (37.8 C)  Resp 18  SpO2 100% Physical Exam  Constitutional: She is oriented to person, place, and time. She appears well-developed and well-nourished. No distress.  HENT:  Head: Normocephalic.  Right Ear: Hearing, tympanic membrane, external ear and ear canal normal.  Left Ear: Hearing, tympanic  membrane, external ear and ear canal normal.  Nose: Rhinorrhea present.  Mouth/Throat: Uvula is midline, oropharynx is clear and moist and mucous membranes are normal.  Eyes: Conjunctivae are normal.  Neck: Neck supple.  Cardiovascular: Normal rate, regular rhythm and normal heart sounds.   Pulmonary/Chest: Effort normal and breath sounds normal. No respiratory distress. She has no wheezes. She has no rales.  Coughing during exam  Abdominal: Soft. Bowel sounds are normal. She exhibits no distension. There is no tenderness. There is no rebound.  Neurological: She is alert and oriented to person, place, and time.  Skin: Skin is warm and dry.  Nursing note and vitals reviewed.   ED Course  Procedures  DIAGNOSTIC STUDIES: Oxygen Saturation is 100% on room air, normal by my interpretation.    COORDINATION OF CARE: 6:00 PM Discussed treatment plan with pt at bedside and pt agreed to plan.  Labs Review Labs Reviewed - No data to display  Imaging Review Dg Chest 2 View  03/12/2014   CLINICAL DATA:  Productive cough for 5 days. Shortness of breath for 2 days. Wheezing. Fever.  EXAM: CHEST  2 VIEW  COMPARISON:  11/09/2011  FINDINGS: The heart size and mediastinal contours are within normal limits. Both lungs are clear. The visualized skeletal structures are unremarkable.  IMPRESSION: No active cardiopulmonary disease.   Electronically Signed   By: Myles Rosenthal M.D.   On: 03/12/2014 18:48     EKG Interpretation None      MDM   Final diagnoses:  Viral URI with cough   Patient with upper respiratory type symptoms, body aches, cough. Temperature 100 orally here. She is slightly tachycardic. Oxygen saturation 100% on room air. Blood pressure is normal. She is nontoxic appearing. She has dry cough during exam. Lungs are clear. Chest x-ray obtained due to pain in the left ribs fever, cough. X-rays negative. Patient's symptoms are most consistent with a viral upper respiratory tract infection.  Possible influenza. She did not have her flu shot that she her. Will discharge home with Robitussin, Sudafed, inhaler, Tylenol Motrin for fever, follow-up with her primary care doctor. Return precautions discussed   Filed Vitals:   03/12/14 1732 03/12/14 1900  BP: 122/81 125/80  Pulse: 109 105  Temp: 100 F (37.8 C) 99.6 F (37.6 C)  TempSrc:  Oral  Resp: 18 18  SpO2: 100% 98%    I personally performed the services described in this documentation, which was scribed in my presence. The recorded information has been reviewed and is accurate.   Lottie Mussel, PA-C 03/12/14 2012  Glynn Octave, MD 03/12/14 [phone]

## 2014-03-12 NOTE — ED Notes (Signed)
Pt states generalized body aches, fever, chills, runny nose and chest congestion. Ongoing for several days. Respirations unlabored. NAD.

## 2014-03-16 ENCOUNTER — Emergency Department (HOSPITAL_COMMUNITY)
Admission: EM | Admit: 2014-03-16 | Discharge: 2014-03-16 | Disposition: A | Discharge status: Home / Self Care | Payer: Medicaid Other | Source: Home / Self Care | Attending: Emergency Medicine | Admitting: Emergency Medicine

## 2014-03-16 ENCOUNTER — Encounter (HOSPITAL_COMMUNITY): Payer: Self-pay | Admitting: Emergency Medicine

## 2014-03-16 DIAGNOSIS — B9789 Other viral agents as the cause of diseases classified elsewhere: Principal | ICD-10-CM

## 2014-03-16 DIAGNOSIS — J069 Acute upper respiratory infection, unspecified: Secondary | ICD-10-CM

## 2014-03-16 MED ORDER — HYDROCODONE-HOMATROPINE 5-1.5 MG/5ML PO SYRP: 5.0000 mL | ORAL_SOLUTION | Freq: Four times a day (QID) | ORAL | Status: DC | PRN

## 2014-03-16 MED ORDER — ONDANSETRON HCL 4 MG PO TABS: 4.0000 mg | ORAL_TABLET | Freq: Three times a day (TID) | ORAL | Status: DC | PRN

## 2014-03-16 MED ORDER — PREDNISONE 50 MG PO TABS: ORAL_TABLET | ORAL | Status: DC

## 2014-03-16 NOTE — Discharge Instructions (Signed)
You have upper airway inflammation from a virus. Take prednisone 1 pill daily for 5 days. Use the Hycodan cough syrup every 4 hours as needed for cough. This will make you sleepy so do not take it while driving or working. Use the Zofran every 8 hours as needed for nausea. You should start to feel better in the next 24-48 hours. If things are getting worse instead of better or you get recurrent fevers, please come back.

## 2014-03-16 NOTE — ED Provider Notes (Signed)
CSN: [id number]     Arrival date & time 03/16/14  1059 History   First MD Initiated Contact with Patient 03/16/14 1220     Chief Complaint  Patient presents with  . Cough   (Consider location/radiation/quality/duration/timing/severity/associated sxs/prior Treatment) HPI She is a 34 year old woman here for evaluation of cough.  Her symptoms started about one week ago. [facility] and diagnosed with a viral URI. She was given Robitussin and Sudafed.  She states that Tuesday, her fevers have resolved. She continues to have some laryngitis and mild rhinorrhea. The cough has been persistent. It is associated with posttussive emesis. It is productive. She also reports continued nausea and vomiting. She is able to tolerate liquids well. She reports some intermittent lightheaded feeling. No shortness of breath or chest pain.  Past Medical History  Diagnosis Date  . Vertigo   . Headache(784.0)   . BV (bacterial vaginosis)   . UTI (lower urinary tract infection)   . Trichimoniasis   . Bilateral external ear infections   . Cervical incompetence affecting management of pregnancy, antepartum 11/10/2012  . Vaginal bleeding in pregnancy 11/10/2012  . Preterm uterine contractions, antepartum 11/10/2012   Past Surgical History  Procedure Laterality Date  . No past surgeries    . Cryoablation    . Cervical cerclage N/A 08/24/2012    Procedure: CERCLAGE CERVICAL;  Surgeon: Oliver PilaKathy W Richardson, MD;  Location: [facility];  Service: Gynecology;  Laterality: N/A;   Family History  Problem Relation Age of Onset  . Diabetes Mother    History  Substance Use Topics  . Smoking status: Never Smoker   . Smokeless tobacco: Never Used  . Alcohol Use: No   OB History    Gravida Para Term Preterm AB TAB SAB Ectopic Multiple Living   1 1  1      1      Review of Systems  Constitutional: Positive for appetite change. Negative for fever and chills.  HENT: Positive for rhinorrhea  and voice change. Negative for congestion and sore throat.   Respiratory: Positive for cough. Negative for shortness of breath.   Cardiovascular: Negative for chest pain.  Gastrointestinal: Positive for nausea and vomiting.  Musculoskeletal: Negative for myalgias.  Neurological: Positive for light-headedness.    Allergies  Review of patient's allergies indicates no known allergies.  Home Medications   Prior to Admission medications   Medication Sig Start Date End Date Taking? Authorizing Provider  guaifenesin (ROBITUSSIN) 100 MG/5ML syrup Take 5-10 mLs (100-200 mg total) by mouth every 4 (four) hours as needed for cough. 03/12/14  Yes Tatyana A Kirichenko, PA-C  cetirizine (ZYRTEC) 10 MG tablet Take 1 tablet (10 mg total) by mouth daily. One tab daily for allergies Patient not taking: Reported on 01/19/2014 09/28/13   Linna HoffJames D Kindl, MD  HYDROcodone-homatropine [facility](HYCODAN) 5-1.5 MG/5ML syrup Take 5 mLs by mouth every 6 (six) hours as needed for cough. 03/16/14   Charm RingsErin J Elizabelle Fite, MD  ipratropium (ATROVENT) 0.06 % nasal spray Place 2 sprays into both nostrils 4 (four) times daily. Patient not taking: Reported on 01/19/2014 09/28/13   Linna HoffJames D Kindl, MD  lansoprazole (PREVACID) 30 MG capsule Take 1 capsule (30 mg total) by mouth daily at 12 noon. Patient not taking: Reported on 01/19/2014 03/31/13   Loren Raceravid Yelverton, MD  meloxicam (MOBIC) 7.5 MG tablet Take 2 tablets (15 mg total) by mouth daily. 01/19/14   Antony MaduraKelly Humes, PA-C  ondansetron (ZOFRAN) 4 MG  tablet Take 1 tablet (4 mg total) by mouth every 8 (eight) hours as needed for nausea or vomiting. 03/16/14   Charm Rings, MD  predniSONE (DELTASONE) 50 MG tablet Take 1 pill daily for 5 days. 03/16/14   Charm Rings, MD  pseudoephedrine (SUDAFED) 30 MG tablet Take 1 tablet (30 mg total) by mouth every 4 (four) hours as needed for congestion. 03/12/14   Tatyana A Kirichenko, PA-C   BP 138/102 mmHg  Pulse 99  Temp(Src) 97.6 F (36.4 C) (Oral)  Resp 16  SpO2 100%   LMP 02/14/2014 Physical Exam  Constitutional: She is oriented to person, place, and time. She appears well-developed and well-nourished. No distress.  HENT:  Head: Normocephalic and atraumatic.  Right Ear: External ear normal.  Left Ear: External ear normal.  Nose: Nose normal.  Mouth/Throat: No oropharyngeal exudate.  Throat is mildly erythematous.  Neck: Neck supple.  Cardiovascular: Normal rate, regular rhythm and normal heart sounds.   No murmur heard. Pulmonary/Chest: Effort normal and breath sounds normal. No respiratory distress. She has no wheezes. She has no rales.  Lymphadenopathy:    She has no cervical adenopathy.  Neurological: She is alert and oriented to person, place, and time.    ED Course  Procedures (including critical care time) Labs Review Labs Reviewed - No data to display  Imaging Review No results found.   MDM   1. Viral URI with cough    She has persistent airway inflammation leading to cough and stomach upset. Overall, I think she is improving. We'll treat with a five-day course of prednisone. Zofran to use as needed for nausea and vomiting. Hycodan to use as needed for coughing. Work note provided. Follow-up if symptoms worsen or no improvement in 1 week.    Charm Rings, MD 03/16/14 1259

## 2014-03-16 NOTE — ED Notes (Signed)
C/o persistent cough onset last Friday Reports she was seen [facility] on [facility]/23 Has also been vomiting due to cough Alert, no signs of acute distress.

## 2014-04-06 ENCOUNTER — Encounter (HOSPITAL_COMMUNITY): Payer: Self-pay | Admitting: *Deleted

## 2014-04-06 ENCOUNTER — Inpatient Hospital Stay (HOSPITAL_COMMUNITY)
Admission: AD | Admit: 2014-04-06 | Discharge: 2014-04-06 | Disposition: A | Discharge status: Home / Self Care | Payer: Medicaid Other | Source: Ambulatory Visit | Attending: Obstetrics & Gynecology | Admitting: Obstetrics & Gynecology

## 2014-04-06 DIAGNOSIS — A5901 Trichomonal vulvovaginitis: Secondary | ICD-10-CM | POA: Diagnosis not present

## 2014-04-06 DIAGNOSIS — M545 Low back pain, unspecified: Secondary | ICD-10-CM

## 2014-04-06 DIAGNOSIS — R112 Nausea with vomiting, unspecified: Secondary | ICD-10-CM | POA: Diagnosis not present

## 2014-04-06 DIAGNOSIS — N939 Abnormal uterine and vaginal bleeding, unspecified: Secondary | ICD-10-CM | POA: Insufficient documentation

## 2014-04-06 LAB — URINALYSIS, ROUTINE W REFLEX MICROSCOPIC
Bilirubin Urine: NEGATIVE
Glucose, UA: NEGATIVE mg/dL
Hgb urine dipstick: NEGATIVE
Ketones, ur: 15 mg/dL — AB
Leukocytes, UA: NEGATIVE
Nitrite: NEGATIVE
PROTEIN: NEGATIVE mg/dL
Urobilinogen, UA: 0.2 mg/dL (ref 0.0–1.0)
pH: 5 (ref 5.0–8.0)

## 2014-04-06 LAB — POCT PREGNANCY, URINE: Preg Test, Ur: NEGATIVE

## 2014-04-06 LAB — WET PREP, GENITAL
Clue Cells Wet Prep HPF POC: NONE SEEN
Yeast Wet Prep HPF POC: NONE SEEN

## 2014-04-06 LAB — HCG, QUANTITATIVE, PREGNANCY: hCG, Beta Chain, Quant, S: <1 m[IU]/mL (ref <5)

## 2014-04-06 MED ORDER — METOCLOPRAMIDE HCL 10 MG PO TABS: 10.0000 mg | ORAL_TABLET | Freq: Four times a day (QID) | ORAL | Status: DC

## 2014-04-06 MED ORDER — IBUPROFEN 800 MG PO TABS: 800.0000 mg | ORAL_TABLET | Freq: Three times a day (TID) | ORAL | Status: DC

## 2014-04-06 MED ORDER — METRONIDAZOLE 500 MG PO TABS
2000.0000 mg | ORAL_TABLET | Freq: Once | ORAL | Status: AC
  Administered 2014-04-06: 2000 mg via ORAL
  Filled 2014-04-06: qty 4

## 2014-04-06 MED ORDER — METOCLOPRAMIDE HCL 10 MG PO TABS
10.0000 mg | ORAL_TABLET | Freq: Once | ORAL | Status: AC
  Administered 2014-04-06: 10 mg via ORAL
  Filled 2014-04-06: qty 1

## 2014-04-06 NOTE — MAU Provider Note (Signed)
History     CSN: [id number]  Arrival date and time: 04/06/14 1718   First Provider Initiated Contact with Patient 04/06/14 1842      Chief Complaint  Patient presents with  . Possible Pregnancy  . Nausea   HPIpt is G1P0 who presents to MAU with nausea and vaginal bleeding when she wipes.  Pt had one +HPT and one neg HPT- not sure if she is pregnant.  Pt has been nauseated and vomiting- pt has been taken zofran given at Urgent Care for URI with vomiting unable to keep medicine down.   Signed Nursing MAU Note 04/06/2014 5:31 PM    Expand All Collapse All   Pt presents to MAU with complaints of one + HPT and one negative HPT. States severe nausea, and vaginal bleeding when she wipes.        Past Medical History  Diagnosis Date  . Vertigo   . Headache(784.0)   . BV (bacterial vaginosis)   . UTI (lower urinary tract infection)   . Trichimoniasis   . Bilateral external ear infections   . Cervical incompetence affecting management of pregnancy, antepartum 11/10/2012  . Vaginal bleeding in pregnancy 11/10/2012  . Preterm uterine contractions, antepartum 11/10/2012    Past Surgical History  Procedure Laterality Date  . Cryoablation    . Cervical cerclage N/A 08/24/2012    Procedure: CERCLAGE CERVICAL;  Surgeon: Oliver Pila, MD;  Location: [facility];  Service: Gynecology;  Laterality: N/A;    Family History  Problem Relation Age of Onset  . Diabetes Mother     History  Substance Use Topics  . Smoking status: Never Smoker   . Smokeless tobacco: Never Used  . Alcohol Use: No    Allergies: No Known Allergies  Prescriptions prior to admission  Medication Sig Dispense Refill Last Dose  . guaifenesin (ROBITUSSIN) 100 MG/5ML syrup Take 5-10 mLs (100-200 mg total) by mouth every 4 (four) hours as needed for cough. 60 mL 0 two weeks  . HYDROcodone-homatropine (HYCODAN) 5-1.5 MG/5ML syrup Take 5 mLs by mouth every 6 (six) hours as needed for cough. 120 mL 0 two weeks  .  meloxicam (MOBIC) 7.5 MG tablet Take 2 tablets (15 mg total) by mouth daily. 30 tablet 0 04/05/2014 at Unknown time  . Multiple Vitamin (MULTIVITAMIN WITH MINERALS) TABS tablet Take 1 tablet by mouth daily.   04/05/2014 at Unknown time  . ondansetron (ZOFRAN) 4 MG tablet Take 1 tablet (4 mg total) by mouth every 8 (eight) hours as needed for nausea or vomiting. 20 tablet 0 04/06/2014 at Unknown time  . pseudoephedrine (SUDAFED) 30 MG tablet Take 1 tablet (30 mg total) by mouth every 4 (four) hours as needed for congestion. 30 tablet 0 Past Month at Unknown time  . cetirizine (ZYRTEC) 10 MG tablet Take 1 tablet (10 mg total) by mouth daily. One tab daily for allergies (Patient not taking: Reported on 01/19/2014) 30 tablet 1 Unknown at Unknown time  . ipratropium (ATROVENT) 0.06 % nasal spray Place 2 sprays into both nostrils 4 (four) times daily. (Patient not taking: Reported on 01/19/2014) 15 mL 1 Unknown at Unknown time  . lansoprazole (PREVACID) 30 MG capsule Take 1 capsule (30 mg total) by mouth daily at 12 noon. (Patient not taking: Reported on 01/19/2014) 30 capsule 0 Unknown at Unknown time  . predniSONE (DELTASONE) 50 MG tablet Take 1 pill daily for 5 days. (Patient not taking: Reported on 04/06/2014) 5 tablet 0     Review  of Systems  Constitutional: Negative for fever and chills.  Gastrointestinal: Positive for nausea and vomiting. Negative for abdominal pain, diarrhea and constipation.  Genitourinary: Negative for dysuria.   Physical Exam   Blood pressure 142/82, pulse 112, temperature 98.2 F (36.8 C), last menstrual period 02/21/2014, unknown if currently breastfeeding.  Physical Exam  Nursing note and vitals reviewed. Constitutional: She is oriented to person, place, and time. She appears well-developed and well-nourished. No distress.  HENT:  Head: Normocephalic.  Eyes: Pupils are equal, round, and reactive to light.  Neck: Normal range of motion. Neck supple.  Cardiovascular: Normal  rate.   Respiratory: Effort normal.  GI: Soft.  Genitourinary: Vagina normal.  Small amount of watery white discharge in vault; cervix clean, NT; uterus and adnexa NSSC NT without palpable enlargement- exam complicated by habitus  Musculoskeletal: Normal range of motion.  Neurological: She is alert and oriented to person, place, and time.  Skin: Skin is warm and dry.  Psychiatric: She has a normal mood and affect.    MAU Course  Procedures Results for orders placed or performed during the hospital encounter of 04/06/14 (from the past 24 hour(s))  Urinalysis, Routine w reflex microscopic     Status: Abnormal   Collection Time: 04/06/14  5:30 PM  Result Value Ref Range   Color, Urine YELLOW YELLOW   APPearance CLEAR CLEAR   Specific Gravity, Urine >1.030 (H) 1.005 - 1.030   pH 5.0 5.0 - 8.0   Glucose, UA NEGATIVE NEGATIVE mg/dL   Hgb urine dipstick NEGATIVE NEGATIVE   Bilirubin Urine NEGATIVE NEGATIVE   Ketones, ur 15 (A) NEGATIVE mg/dL   Protein, ur NEGATIVE NEGATIVE mg/dL   Urobilinogen, UA 0.2 0.0 - 1.0 mg/dL   Nitrite NEGATIVE NEGATIVE   Leukocytes, UA NEGATIVE NEGATIVE  Pregnancy, urine POC     Status: None   Collection Time: 04/06/14  5:48 PM  Result Value Ref Range   Preg Test, Ur NEGATIVE NEGATIVE  hCG, quantitative, pregnancy     Status: None   Collection Time: 04/06/14  6:05 PM  Result Value Ref Range   hCG, Beta Chain, Quant, S <1 <5 mIU/mL  Reglan 10mg  PO given- pt's nausea relieved- pt fell asleep Results for orders placed or performed during the hospital encounter of 04/06/14 (from the past 24 hour(s))  Urinalysis, Routine w reflex microscopic     Status: Abnormal   Collection Time: 04/06/14  5:30 PM  Result Value Ref Range   Color, Urine YELLOW YELLOW   APPearance CLEAR CLEAR   Specific Gravity, Urine >1.030 (H) 1.005 - 1.030   pH 5.0 5.0 - 8.0   Glucose, UA NEGATIVE NEGATIVE mg/dL   Hgb urine dipstick NEGATIVE NEGATIVE   Bilirubin Urine NEGATIVE NEGATIVE    Ketones, ur 15 (A) NEGATIVE mg/dL   Protein, ur NEGATIVE NEGATIVE mg/dL   Urobilinogen, UA 0.2 0.0 - 1.0 mg/dL   Nitrite NEGATIVE NEGATIVE   Leukocytes, UA NEGATIVE NEGATIVE  Pregnancy, urine POC     Status: None   Collection Time: 04/06/14  5:48 PM  Result Value Ref Range   Preg Test, Ur NEGATIVE NEGATIVE  hCG, quantitative, pregnancy     Status: None   Collection Time: 04/06/14  6:05 PM  Result Value Ref Range   hCG, Beta Chain, Quant, S <1 <5 mIU/mL  Wet prep, genital     Status: Abnormal   Collection Time: 04/06/14  8:32 PM  Result Value Ref Range   Yeast Wet Prep HPF POC  NONE SEEN NONE SEEN   Trich, Wet Prep FEW (A) NONE SEEN   Clue Cells Wet Prep HPF POC NONE SEEN NONE SEEN   WBC, Wet Prep HPF POC FEW (A) NONE SEEN  Flagyl 2 gm for Trich Assessment and Plan  Trichomonas - flagyl 2 gm in MAU- pt to get treated Nausea and vomiting- Reglan  #20 F/u with physician of choice- pt has PCP in [facility] Pt to keep tabs on BP and f/u with MD Back pain ?strain- Ibuprofen  RX ( not to take with Mobic or other NSAIDS) LINEBERRY,SUSAN 04/06/2014, 6:44 PM

## 2014-04-06 NOTE — MAU Note (Signed)
Pt presents to MAU with complaints of one + HPT and one negative HPT. States severe nausea, and vaginal bleeding when she wipes.

## 2014-04-08 LAB — GC/CHLAMYDIA PROBE AMP (~~LOC~~) NOT AT ARMC
CHLAMYDIA, DNA PROBE: NEGATIVE
Neisseria Gonorrhea: NEGATIVE

## 2014-04-23 ENCOUNTER — Emergency Department (HOSPITAL_COMMUNITY)
Admission: EM | Admit: 2014-04-23 | Discharge: 2014-04-23 | Disposition: A | Discharge status: Home / Self Care | Payer: Self-pay | Source: Home / Self Care | Attending: Family Medicine | Admitting: Family Medicine

## 2014-04-23 ENCOUNTER — Encounter (HOSPITAL_COMMUNITY): Payer: Self-pay | Admitting: Emergency Medicine

## 2014-04-23 DIAGNOSIS — H8113 Benign paroxysmal vertigo, bilateral: Secondary | ICD-10-CM

## 2014-04-23 LAB — POCT URINALYSIS DIP (DEVICE)
BILIRUBIN URINE: NEGATIVE
GLUCOSE, UA: NEGATIVE mg/dL
HGB URINE DIPSTICK: NEGATIVE
Ketones, ur: NEGATIVE mg/dL
Nitrite: NEGATIVE
Protein, ur: NEGATIVE mg/dL
UROBILINOGEN UA: 0.2 mg/dL (ref 0.0–1.0)
pH: 6.5 (ref 5.0–8.0)

## 2014-04-23 LAB — POCT PREGNANCY, URINE: PREG TEST UR: NEGATIVE

## 2014-04-23 MED ORDER — ONDANSETRON HCL 4 MG PO TABS: 4.0000 mg | ORAL_TABLET | Freq: Three times a day (TID) | ORAL | Status: DC | PRN

## 2014-04-23 MED ORDER — MECLIZINE HCL 50 MG PO TABS: 25.0000 mg | ORAL_TABLET | Freq: Three times a day (TID) | ORAL | Status: DC | PRN

## 2014-04-23 NOTE — Discharge Instructions (Signed)
Your symptoms are from a condition called BPV. Please perform Apley's maneuvers. Please use the closing for the dizziness and nausea and Zofran for her nausea. Please go to the emergency room if you become worse.   Benign Positional Vertigo Vertigo means you feel like you or your surroundings are moving when they are not. Benign positional vertigo is the most common form of vertigo. Benign means that the cause of your condition is not serious. Benign positional vertigo is more common in older adults. CAUSES  Benign positional vertigo is the result of an upset in the labyrinth system. This is an area in the middle ear that helps control your balance. This may be caused by a viral infection, head injury, or repetitive motion. However, often no specific cause is found. SYMPTOMS  Symptoms of benign positional vertigo occur when you move your head or eyes in different directions. Some of the symptoms may include:  Loss of balance and falls.  Vomiting.  Blurred vision.  Dizziness.  Nausea.  Involuntary eye movements (nystagmus). DIAGNOSIS  Benign positional vertigo is usually diagnosed by physical exam. If the specific cause of your benign positional vertigo is unknown, your caregiver may perform imaging tests, such as magnetic resonance imaging (MRI) or computed tomography (CT). TREATMENT  Your caregiver may recommend movements or procedures to correct the benign positional vertigo. Medicines such as meclizine, benzodiazepines, and medicines for nausea may be used to treat your symptoms. In rare cases, if your symptoms are caused by certain conditions that affect the inner ear, you may need surgery. HOME CARE INSTRUCTIONS   Follow your caregiver's instructions.  Move slowly. Do not make sudden body or head movements.  Avoid driving.  Avoid operating heavy machinery.  Avoid performing any tasks that would be dangerous to you or others during a vertigo episode.  Drink enough fluids to  keep your urine clear or pale yellow. SEEK IMMEDIATE MEDICAL CARE IF:   You develop problems with walking, weakness, numbness, or using your arms, hands, or legs.  You have difficulty speaking.  You develop severe headaches.  Your nausea or vomiting continues or gets worse.  You develop visual changes.  Your family or friends notice any behavioral changes.  Your condition gets worse.  You have a fever.  You develop a stiff neck or sensitivity to light. MAKE SURE YOU:   Understand these instructions.  Will watch your condition.  Will get help right away if you are not doing well or get worse. Document Released: 10/12/2005 Document Revised: 03/29/2011 Document Reviewed: 09/24/2010 [facility] Patient Information 2015 [employer] [place]. This information is not intended to replace advice given to you by your health care provider. Make sure you discuss any questions you have with your health care provider.

## 2014-04-23 NOTE — ED Notes (Signed)
Reports a 3 wk hx n/v and dizziness.  Pt [facility] med[facility].  Pt thought she was having the symptoms because she thought she was pregnant.  Pregnancy tests were negative.

## 2014-04-23 NOTE — ED Provider Notes (Signed)
CSN: [id number]     Arrival date & time 04/23/14  4098 History   First MD Initiated Contact with Patient 04/23/14 [phone]     Chief Complaint  Patient presents with  . Dizziness  . Emesis   (Consider location/radiation/quality/duration/timing/severity/associated sxs/prior Treatment) HPI  Dizziness: started 2-3 weeks ago. Went to [facility] on 04/06/14 and was Dx w/ Ivery Quale and treated adequately. Worse w/ head moving and bending over. Zofran w/ some improvement. Emesis after getting dizzy. Associated w/ HA. Tylenol for HA w/ improvement. LMP 03/28/14 and not sexually active since February. Symptoms have not gotten any better over this period of time. Symptoms are intermittent. Has had issues with vertigo. He also not associated with patient going from lying or sitting to standing and ambulating. Denies chest pain, palpitations, fevers, abdominal pain, dysuria, frequency, back pain, loss of hearing, worsening headache, unilateral numbness weakness or facial droop. No further vaginal issues since treatment for trichomoniasis.  Past Medical History  Diagnosis Date  . Vertigo   . Headache(784.0)   . BV (bacterial vaginosis)   . UTI (lower urinary tract infection)   . Trichimoniasis   . Bilateral external ear infections   . Cervical incompetence affecting management of pregnancy, antepartum 11/10/2012  . Vaginal bleeding in pregnancy 11/10/2012  . Preterm uterine contractions, antepartum 11/10/2012   Past Surgical History  Procedure Laterality Date  . Cryoablation    . Cervical cerclage N/A 08/24/2012    Procedure: CERCLAGE CERVICAL;  Surgeon: Oliver Pila, MD;  Location: [facility];  Service: Gynecology;  Laterality: N/A;   Family History  Problem Relation Age of Onset  . Diabetes Mother    History  Substance Use Topics  . Smoking status: Never Smoker   . Smokeless tobacco: Never Used  . Alcohol Use: No   OB History    Gravida Para Term Preterm AB TAB SAB Ectopic Multiple Living   Review of Systems Per HPI with all other pertinent systems negative.   Allergies  Review of patient's allergies indicates no known allergies.  Home Medications   Prior to Admission medications   Medication Sig Start Date End Date Taking? Authorizing Provider  meclizine (ANTIVERT) 50 MG tablet Take 0.5 tablets (25 mg total) by mouth 3 (three) times daily as needed for dizziness or nausea. 04/23/14   Ozella Rocks, MD  Multiple Vitamin (MULTIVITAMIN WITH MINERALS) TABS tablet Take 1 tablet by mouth daily.    Historical Provider, MD  ondansetron (ZOFRAN) 4 MG tablet Take 1 tablet (4 mg total) by mouth every 8 (eight) hours as needed for nausea or vomiting. 04/23/14   Ozella Rocks, MD   BP 143/96 mmHg  Pulse 82  Temp(Src) 97.1 F (36.2 C) (Oral)  Resp 16  SpO2 98%  LMP 03/28/2014 Physical Exam Physical Exam  Constitutional: oriented to person, place, and time. appears well-developed and well-nourished. No distress.  HENT:  Head: Normocephalic and atraumatic.  Eyes: EOMI. PERRL.  Neck: Normal range of motion.  Cardiovascular: RRR, no m/r/g, 2+ distal pulses,  Pulmonary/Chest: Effort normal and breath sounds normal. No respiratory distress.  Abdominal: Soft. Bowel sounds are normal. NonTTP, no distension.  Musculoskeletal: Normal range of motion. Non ttp, no effusion.  Neurological: alert and oriented to person, place, and time.  cranial nerves II through XII grossly intact. Moves all extremities in a coordinated fashion. Dix-Hallpike positive bilaterally based on symptomatology with right greater than left  though no nystagmus, immediately or delayed. Skin: Skin is warm. No rash noted. non diaphoretic.  Psychiatric: normal mood and affect. behavior is normal. Judgment and thought content normal.   ED Course  Procedures (including critical care time) Labs Review Labs Reviewed  POCT URINALYSIS DIP (DEVICE) - Abnormal; Notable for the following:    Leukocytes, UA TRACE  (*)    All other components within normal limits  POCT PREGNANCY, URINE    Imaging Review No results found.   MDM   1. BPV (benign positional vertigo), bilateral    Meclizine, Zofran, Epley's maneuvers. Follow-up with PCP. Trichomonal vaginitis appears to be adequately treated.  Urine pregnancy negative UA without infection.  Precautions given and all questions answered     Ozella Rocksavid J Vercie Pokorny, MD 04/23/14 [phone]

## 2015-02-20 ENCOUNTER — Encounter (HOSPITAL_COMMUNITY): Payer: Self-pay

## 2015-02-20 ENCOUNTER — Emergency Department (HOSPITAL_COMMUNITY)
Admission: EM | Admit: 2015-02-20 | Discharge: 2015-02-20 | Disposition: A | Discharge status: Home / Self Care | Payer: Medicaid Other | Attending: Emergency Medicine | Admitting: Emergency Medicine

## 2015-02-20 DIAGNOSIS — B9689 Other specified bacterial agents as the cause of diseases classified elsewhere: Secondary | ICD-10-CM

## 2015-02-20 DIAGNOSIS — Z87440 Personal history of urinary (tract) infections: Secondary | ICD-10-CM | POA: Insufficient documentation

## 2015-02-20 DIAGNOSIS — N760 Acute vaginitis: Secondary | ICD-10-CM | POA: Insufficient documentation

## 2015-02-20 DIAGNOSIS — Z79899 Other long term (current) drug therapy: Secondary | ICD-10-CM | POA: Insufficient documentation

## 2015-02-20 DIAGNOSIS — Z8619 Personal history of other infectious and parasitic diseases: Secondary | ICD-10-CM | POA: Insufficient documentation

## 2015-02-20 DIAGNOSIS — H8110 Benign paroxysmal vertigo, unspecified ear: Secondary | ICD-10-CM | POA: Insufficient documentation

## 2015-02-20 DIAGNOSIS — Z3202 Encounter for pregnancy test, result negative: Secondary | ICD-10-CM | POA: Insufficient documentation

## 2015-02-20 LAB — BASIC METABOLIC PANEL
ANION GAP: 9 (ref 5–15)
BUN: 8 mg/dL (ref 6–20)
CALCIUM: 8.9 mg/dL (ref 8.9–10.3)
CHLORIDE: 106 mmol/L (ref 101–111)
CO2: 21 mmol/L — ABNORMAL LOW (ref 22–32)
CREATININE: 0.58 mg/dL (ref 0.44–1.00)
Glucose, Bld: 98 mg/dL (ref 65–99)
POTASSIUM: 4.1 mmol/L (ref 3.5–5.1)
SODIUM: 136 mmol/L (ref 135–145)

## 2015-02-20 LAB — CBC
HCT: 38.7 % (ref 36.0–46.0)
HEMOGLOBIN: 12.7 g/dL (ref 12.0–15.0)
MCH: 25.9 pg — AB (ref 26.0–34.0)
MCHC: 32.8 g/dL (ref 30.0–36.0)
MCV: 79 fL (ref 78.0–100.0)
Platelets: 262 10*3/uL (ref 150–400)
RBC: 4.9 MIL/uL (ref 3.87–5.11)
RDW: 16.1 % — ABNORMAL HIGH (ref 11.5–15.5)
WBC: 5.5 10*3/uL (ref 4.0–10.5)

## 2015-02-20 LAB — PREGNANCY, URINE: PREG TEST UR: NEGATIVE

## 2015-02-20 LAB — WET PREP, GENITAL
Sperm: NONE SEEN
Trich, Wet Prep: NONE SEEN
Yeast Wet Prep HPF POC: NONE SEEN

## 2015-02-20 MED ORDER — ONDANSETRON 4 MG PO TBDP
4.0000 mg | ORAL_TABLET | Freq: Once | ORAL | Status: AC
  Administered 2015-02-20: 4 mg via ORAL
  Filled 2015-02-20: qty 1

## 2015-02-20 MED ORDER — METRONIDAZOLE 500 MG PO TABS: 500.0000 mg | ORAL_TABLET | Freq: Two times a day (BID) | ORAL | Status: DC

## 2015-02-20 MED ORDER — CEFTRIAXONE SODIUM 250 MG IJ SOLR
250.0000 mg | Freq: Once | INTRAMUSCULAR | Status: AC
Start: 2015-02-20 — End: 2015-02-20
  Administered 2015-02-20: 250 mg via INTRAMUSCULAR
  Filled 2015-02-20: qty 250

## 2015-02-20 MED ORDER — ONDANSETRON HCL 4 MG/2ML IJ SOLN
4.0000 mg | Freq: Once | INTRAMUSCULAR | Status: DC
  Filled 2015-02-20: qty 2

## 2015-02-20 MED ORDER — AZITHROMYCIN 250 MG PO TABS
1000.0000 mg | ORAL_TABLET | Freq: Once | ORAL | Status: AC
  Administered 2015-02-20: 1000 mg via ORAL
  Filled 2015-02-20: qty 4

## 2015-02-20 MED ORDER — MECLIZINE HCL 25 MG PO TABS
12.5000 mg | ORAL_TABLET | Freq: Once | ORAL | Status: AC
  Administered 2015-02-20: 12.5 mg via ORAL
  Filled 2015-02-20: qty 1

## 2015-02-20 MED ORDER — SODIUM CHLORIDE 0.9 % IV BOLUS (SEPSIS): 1000.0000 mL | Freq: Once | INTRAVENOUS | Status: DC

## 2015-02-20 NOTE — ED Notes (Signed)
Pt presents with multiple complaints.  Pt reports 3 week h/o vaginal discharge that is yellow with foul odor with vaginal pain, denies abdominal pain.  Reports last week her sugar had dropped into the 50s x 3-4 times.  Reports onset of dizziness with nausea, vomiting.

## 2015-02-20 NOTE — ED Provider Notes (Signed)
CSN: [id number]     Arrival date & time 02/20/15  4098 History   First MD Initiated Contact with Patient 02/20/15 0902     No chief complaint on file.  (Consider location/radiation/quality/duration/timing/severity/associated sxs/prior Treatment) HPI 35 y.o. female presents to the Emergency Department today complaining of vaginal discharge x 3 weeks. Notes yellow coloration with odor. No fevers. States that the pain is 5/10 throbbing and feels it inside her vagina. No lower abdominal pain. No dysuria. One sexual partner. Has also had associated N/V as well for the past month with dizziness. Was diagnosed with BPPV last year and given Meclizine with complete improvement of symptoms. Symptoms not associated with sitting to standing or ambulating. No CP/ palpitations, loss of hearing, worsening headache, numbness/weakness, no facial droop. No other symptoms noted.     Past Medical History  Diagnosis Date  . Vertigo   . Headache(784.0)   . BV (bacterial vaginosis)   . UTI (lower urinary tract infection)   . Trichimoniasis   . Bilateral external ear infections   . Cervical incompetence affecting management of pregnancy, antepartum 11/10/2012  . Vaginal bleeding in pregnancy 11/10/2012  . Preterm uterine contractions, antepartum 11/10/2012   Past Surgical History  Procedure Laterality Date  . Cryoablation    . Cervical cerclage N/A 08/24/2012    Procedure: CERCLAGE CERVICAL;  Surgeon: Oliver Pila, MD;  Location: [facility];  Service: Gynecology;  Laterality: N/A;   Family History  Problem Relation Age of Onset  . Diabetes Mother    Social History  Substance Use Topics  . Smoking status: Never Smoker   . Smokeless tobacco: Never Used  . Alcohol Use: No   OB History    Gravida Para Term Preterm AB TAB SAB Ectopic Multiple Living   Review of Systems ROS reviewed and all are negative for acute change except as noted in the HPI.  Allergies  Review of patient's  allergies indicates no known allergies.  Home Medications   Prior to Admission medications   Medication Sig Start Date End Date Taking? Authorizing Provider  meclizine (ANTIVERT) 50 MG tablet Take 0.5 tablets (25 mg total) by mouth 3 (three) times daily as needed for dizziness or nausea. 04/23/14   Ozella Rocks, MD  Multiple Vitamin (MULTIVITAMIN WITH MINERALS) TABS tablet Take 1 tablet by mouth daily.    Historical Provider, MD  ondansetron (ZOFRAN) 4 MG tablet Take 1 tablet (4 mg total) by mouth every 8 (eight) hours as needed for nausea or vomiting. 04/23/14   Ozella Rocks, MD   BP 127/65 mmHg  Pulse 84  Temp(Src) 97.9 F (36.6 C) (Oral)  Resp 20  Ht  (1.499 m)  Wt 134.265 kg  BMI 59.75 kg/m2  SpO2 100%  LMP 02/03/2015   Physical Exam  Constitutional: She is oriented to person, place, and time. She appears well-developed and well-nourished.  HENT:  Head: Normocephalic and atraumatic.  Eyes: EOM are normal.  Neck: Normal range of motion.  Cardiovascular: Normal rate, regular rhythm and normal heart sounds.   Pulmonary/Chest: Effort normal and breath sounds normal.  Abdominal: Soft. Normal appearance and bowel sounds are normal. She exhibits no distension, no ascites and no mass. There is no tenderness. There is no rigidity, no rebound, no guarding, no CVA tenderness, no tenderness at McBurney's point and negative Murphy's sign.  Musculoskeletal: Normal range of motion.  Neurological: She is alert and  oriented to person, place, and time.  Skin: Skin is warm and dry.  Psychiatric: She has a normal mood and affect. Her behavior is normal. Thought content normal.  Nursing note and vitals reviewed.  Exam performed by Eston Esters,  exam chaperoned Date: 02/20/2015 Pelvic exam: normal external genitalia without evidence of trauma. VULVA: normal appearing vulva with no masses, tenderness or lesion. VAGINA: normal appearing vagina with normal color and discharge, no  lesions. CERVIX: normal appearing cervix without lesions, no cervical motion tenderness present, cervical os closed with out purulent discharge; vaginal discharge - clear, Wet prep and DNA probe for chlamydia and GC obtained.   ADNEXA: normal adnexa in size, nontender and no masses UTERUS: uterus is normal size, shape, consistency and nontender.   ED Course  Procedures (including critical care time) Labs Review Labs Reviewed  WET PREP, GENITAL - Abnormal; Notable for the following:    Clue Cells Wet Prep HPF POC PRESENT (*)    WBC, Wet Prep HPF POC MANY (*)    All other components within normal limits  CBC - Abnormal; Notable for the following:    MCH 25.9 (*)    RDW 16.1 (*)    All other components within normal limits  BASIC METABOLIC PANEL - Abnormal; Notable for the following:    CO2 21 (*)    All other components within normal limits  PREGNANCY, URINE  RPR  GC/CHLAMYDIA PROBE AMP ([place]) NOT AT [facility]   Imaging Review No results found. I have personally reviewed and evaluated these images and lab results as part of my medical decision-making.   EKG Interpretation None     MDM  I have reviewed relevant laboratory values. I personally interpreted the relevant EKG. I have reviewed the relevant previous healthcare records. I obtained HPI from historian. Patient discussed with supervising physician  ED Course:  Assessment: 36y F with vaginal pain/discharge x 3 weeks. No Dysuria. On exam, clear discharge noted. Cervix closed. No blood noted. Labs unremarkable. Pregnancy Negative. GC/Wet Prep showed BV as well as WBC. Will treat with Flagyl outpatient. Given Rocephin/Azithro in ED. Previous note at [facility] suggested BPPV diagnosis for dizziness and nausea. Pt reports that pt improved completely with meclizine. Will DC with refill. Given resource guide for PCP follow up.  Patient is in no acute distress. Vital Signs are stable. Patient is able to ambulate. Patient able to tolerate  PO.       Disposition/Plan:  DC Home Additional Verbal discharge instructions given and discussed with patient.  Pt Instructed to f/u with PCP in the next 48 hours for evaluation and treatment of symptoms. Return precautions given Pt acknowledges and agrees with plan   Supervising Physician Loren Racer, MD   Final diagnoses:  BPPV (benign paroxysmal positional vertigo), unspecified laterality  Bacterial vaginosis       Audry Pili, PA-C 02/20/15 1137  Loren Racer, MD 02/20/15 1536

## 2015-02-20 NOTE — Discharge Instructions (Signed)
Please read and follow all provided instructions.  Your diagnoses today include: No diagnosis found.  Tests performed today include:  Vital signs. See below for your results today.   Medications prescribed:  You were treated for chlamydia (1 gram azithromycin pills) and gonorrhea (250mg  rocephin shot). It takes 1-2 days for an official diagnosis. You will be called if there is a positive result.   Home care instructions:  Follow any educational materials contained in this packet.  Follow-up instructions: Please follow-up with your primary care provider in the next 48-72 hours for further evaluation of symptoms and treatment   Return instructions:   Please return to the Emergency Department if you do not get better, if you get worse, or new symptoms OR  - Fever (temperature greater than 101.38F)  - Bleeding that does not stop with holding pressure to the area    -Severe pain (please note that you may be more sore the day after your accident)  - Chest Pain  - Difficulty breathing  - Severe nausea or vomiting  - Inability to tolerate food and liquids  - Passing out  - Skin becoming red around your wounds  - Change in mental status (confusion or lethargy)  - New numbness or weakness     Please return if you have any other emergent concerns.  Additional Information:  Your vital signs today were: BP 127/65 mmHg   Pulse 84   Temp(Src) 97.9 F (36.6 C) (Oral)   Resp 20   Ht 4\' 11"  (1.499 m)   Wt 134.265 kg   BMI 59.75 kg/m2   SpO2 100%   LMP 02/03/2015 If your blood pressure (BP) was elevated above 135/85 this visit, please have this repeated by your doctor within one month. ---------------    Emergency Department Resource Guide 1) Find a Doctor and Pay Out of Pocket Although you won't have to find out who is covered by your insurance plan, it is a good idea to ask around and get recommendations. You will then need to call the office and see if the doctor you have chosen will  accept you as a new patient and what types of options they offer for patients who are self-pay. Some doctors offer discounts or will set up payment plans for their patients who do not have insurance, but you will need to ask so you aren't surprised when you get to your appointment.  2) Contact Your Local Health Department Not all health departments have doctors that can see patients for sick visits, but many do, so it is worth a call to see if yours does. If you don't know where your local health department is, you can check in your phone book. The CDC also has a tool to help you locate your state's health department, and many state websites also have listings of all of their local health departments.  3) Find a Walk-in Clinic If your illness is not likely to be very severe or complicated, you may want to try a walk in clinic. These are popping up all over the country in pharmacies, drugstores, and shopping centers. They're usually staffed by nurse practitioners or physician assistants that have been trained to treat common illnesses and complaints. They're usually fairly quick and inexpensive. However, if you have serious medical issues or chronic medical problems, these are probably not your best option.  No Primary Care Doctor: - Call Health Connect at  [phone] - they can help you locate a primary care doctor that  accepts your insurance, provides certain services, etc. - Physician Referral Service- [phone]  Chronic Pain Problems: Organization         Address  Phone   Notes  [facility]  [phone] Patients need to be referred by their primary care doctor.   Medication Assistance: Organization         Address  Phone   Notes  [facility] Medication [facility] [address] [place] [place] [place], [place] [address] [phone] --Must be a resident of [facility] -- Must have NO insurance coverage whatsoever (no Medicaid/ Medicare, etc.) -- The pt.  MUST have a primary care doctor that directs their care regularly and follows them in the community   [facility]ist  [phone]   [employer]  [phone]    Agencies that provide inexpensive medical care: Organization         Address  Phone   Notes  [facility]  [phone]   [facility]    [phone]   [facility] [address] [place], [place] [address] [phone]   [facility] [address] [place], [place] [phone]   [facility]anned Parenthood    [phone]   [facility]    [phone]   [facility]  [address], [place] Phone:  [phone], Fax:  [phone] Hours of Operation:  9 am - 6 pm, M-F.  Also accepts Medicaid/Medicare and self-pay.  [facility]  [address], [place] Phone: [phone], Fax: [phone]. Hours of Operation:  8:30 am - 5:30 pm, M-F.  Also accepts Medicaid and self-pay.  [facility] [place] [address] [place] Phone: [phone]   [facility] [address] Natasha Bence [place], [place] [phone], Ext. [phone] Mondays & Thursdays: 7-9 AM.  First 15 patients are seen on a first come, first serve basis.    Medicaid-accepting [facility] Providers:  Organization         Address  Phone   Notes  [facility] [address], [place] [phone] Also accepts self-pay patients.  [facility] [address] Laurell Josephs [place], [place]  [phone]   [facility] [address], [place] [phone]   [facility] [address] [place] [phone]   Renaye Rakers [address], [place]   [phone] Only accepts [place] Access [place] patients after they have their name applied to their card.   Self-Pay (no insurance) in  [facility]:  Organization         Address  Phone   Notes  Sickle Cell Patients, [facility] [address] [place], [place] [phone]   [facility] [address] [place], [place] [phone]   [facility]  [address],  [phone]   [facility]/Dr. Osei-Bonsu  [address], [place] or [address], [place] [phone] Phone number for both [place] and [place] locations is the same.  [facility] [address] [place] [phone]   [facility] [address] [place] or [address] [phone]   [facility] [address] [place] [phone], phone; [phone], fax Sees patients 1st and 3rd Saturday of every month.  Must not qualify for public or private insurance (i.e. Medicaid, Medicare, [place] Health Choice, Veterans' Benefits)  Household income should be no more than 200% of the poverty level The clinic cannot treat you if you are pregnant or think you are pregnant  Sexually transmitted diseases are not treated at the clinic.    Dental Care: Organization         Address  Phone  Notes  [facility] Department of [facility] [address] [place], [place] [phone] Accepts children up to age 8 who are enrolled in [place] or [place] Health Choice; pregnant women with a Medicaid card; and children who have applied for Medicaid or [place] Health Choice, but were declined, whose parents can pay a reduced fee at time of service.  [facility] Department of [facility]  [address], [place] [phone] Accepts children up to age 31 who are enrolled in [place] or [place] Health Choice; pregnant women with a Medicaid card; and children who have applied for Medicaid or [place] Health Choice, but were declined, whose  parents can pay a reduced fee at time of service.  [facility]  [address] [place], [place] [phone] Patients are seen by appointment only. Walk-ins are not accepted. [facility] will see patients 54 years of age and older. Monday - Tuesday (8am-5pm) Most Wednesdays (8:30-5pm) $30 per visit, cash only  [facility] Adult Dental Access PROGRAM  [address], [facility] [phone] Patients are seen by appointment only. Walk-ins are not accepted. [facility] will see patients 33 years of age and older. One Wednesday Evening (Monthly: Volunteer Based).  $30 per visit, cash only  [employer]  [phone] for adults; Children under age 90, call Graduate Pediatric Dentistry at [phone]. Children aged 8-14, please call [phone] to request a pediatric application.  Dental services are provided in all areas of dental care including fillings, crowns and bridges, complete and partial dentures, implants, gum treatment, root canals, and extractions. Preventive care is also provided. Treatment is provided to both adults and children. Patients are selected via a lottery and there is often a waiting list.   [facility] [address], [place]  [phone] [email or web address]   [facility] [address] [place], [place] [phone], Ext. [phone] Second and Fourth Thursday of each month, opens at 6:30 AM; Clinic ends at 9 AM.  Patients are seen on a first-come first-served basis, and a limited number are seen during each clinic.   [facility]  [address] [place], [place] [phone]   Eligibility Requirements You must have lived in [place], [place], or [place] counties for at least the last three months.   You cannot be eligible for state or federal sponsored [place], including [employer], [place], or [employer].   You generally cannot be eligible for  healthcare insurance through your employer.    How to apply: Eligibility screenings are held every Tuesday and Wednesday afternoon from 1:00 pm until 4:00 pm. You do not need an appointment for the interview!  [facility] [address] [place], [place] [phone]   [facility] Health Department  [phone]   [facility] Department  [phone]   [facility] Department  [phone]    Behavioral Health Resources in the Community: Intensive Outpatient Programs Organization         Address  Phone  Notes  [facility] [address] [place], [place] [phone]   [facility] [address] [place], [place] [phone]   [facility] [address], [place], [place]  [phone]   [facility] Mental Health [address],  [place], [place] [phone] or [phone]   Substance Abuse Resources Organization         Address  Phone  Notes  [facility] Drug Services  [phone]   [facility]  [phone]   [facility]  [phone]   Floydene Flock  [phone]   Residential & Outpatient Substance Abuse Program  [phone]   Psychological Services Organization         Address  Phone  Notes  [facility] Behavioral Health  [phone]   [facility]  [phone]   [facility] Mental Health [address] [place], [place] [phone] or [phone]    Mobile Crisis Teams Organization         Address  Phone  Notes  [facility], Mobile Crisis Care Unit  [phone]   [facility]  [address] [place], [place] [phone]   Doristine Locks [address] [place] [phone]    Self-Help/Support Groups Organization         Address  Phone             Notes  [facility] [place] - variety of support groups  336- I7437963 Call for more information  Narcotics  Anonymous (NA), [facility] [address], [employer] [place]  2 meetings at this location   Statistician         Address  Phone  Notes  [facility] [address],    [place]  [phone]   [facility]  [address], [place] [address], [place], [place] [phone]   [facility] [address], [place] [phone] Admissions: 8am-3pm M-F  [facility] [address]    [place], [place] [phone]   [facility] [address] [place], [place], [place] [phone]   [facility] [address],  [place], [place] [phone]   [facility] - Intensive Outpatient [address]ure[address], [place], [place] [phone]   [facility] ([facility] [address]  [place], [place] [phone] or [phone]   [facility] ([facility]) [address] [place], [place] [phone] Accepts Medicaid  [facility] [address],  [place] [phone] Substance Abuse/Addiction Treatment   [facility] Organization         Address  Phone  Notes  [facility]  [phone]   Angie Fava, PhD [address] Ervin Knack [place], [place]   [phone] or [phone]   [facility]   [address] [place], [place] [phone]   [facility] [address] [place], [place] [phone] Insurance/Medicaid/sponsorship through [employer] and Families [address]                                    [place], [place] [phone] Therapy/tele-psych/case  [facility] [address] [place]  [address]   [place], [place] [phone]    Dr. Lolly Mustache  [phone]   [facility]  [employer] [facility] Dept. 1) [address] [place] 2) [address] [place] 3)  [address], [place] [phone]  [phone]   [facility] Child Abuse Hotline [phone] or [phone] (After Hours)

## 2015-02-21 LAB — RPR: RPR: NONREACTIVE

## 2015-02-21 LAB — GC/CHLAMYDIA PROBE AMP (~~LOC~~) NOT AT ARMC
Chlamydia: NEGATIVE
NEISSERIA GONORRHEA: NEGATIVE

## 2015-05-10 ENCOUNTER — Encounter (HOSPITAL_COMMUNITY): Payer: Self-pay

## 2015-05-10 ENCOUNTER — Emergency Department (HOSPITAL_COMMUNITY)
Admission: EM | Admit: 2015-05-10 | Discharge: 2015-05-10 | Disposition: A | Discharge status: Home / Self Care | Payer: Medicaid Other | Attending: Emergency Medicine | Admitting: Emergency Medicine

## 2015-05-10 ENCOUNTER — Emergency Department (HOSPITAL_COMMUNITY): Payer: Medicaid Other

## 2015-05-10 DIAGNOSIS — Z791 Long term (current) use of non-steroidal anti-inflammatories (NSAID): Secondary | ICD-10-CM | POA: Insufficient documentation

## 2015-05-10 DIAGNOSIS — R0789 Other chest pain: Secondary | ICD-10-CM

## 2015-05-10 LAB — BASIC METABOLIC PANEL
Anion gap: 6 (ref 5–15)
BUN: 11 mg/dL (ref 6–20)
CHLORIDE: 107 mmol/L (ref 101–111)
CO2: 23 mmol/L (ref 22–32)
CREATININE: 0.65 mg/dL (ref 0.44–1.00)
Calcium: 8.8 mg/dL — ABNORMAL LOW (ref 8.9–10.3)
GFR calc Af Amer: >60 mL/min (ref >60)
GFR calc non Af Amer: >60 mL/min (ref >60)
GLUCOSE: 98 mg/dL (ref 65–99)
POTASSIUM: 3.8 mmol/L (ref 3.5–5.1)
SODIUM: 136 mmol/L (ref 135–145)

## 2015-05-10 LAB — CBC
HEMATOCRIT: 34.7 % — AB (ref 36.0–46.0)
Hemoglobin: 11.4 g/dL — ABNORMAL LOW (ref 12.0–15.0)
MCH: 25.3 pg — AB (ref 26.0–34.0)
MCHC: 32.9 g/dL (ref 30.0–36.0)
MCV: 77.1 fL — ABNORMAL LOW (ref 78.0–100.0)
Platelets: 154 10*3/uL (ref 150–400)
RBC: 4.5 MIL/uL (ref 3.87–5.11)
RDW: 16.4 % — AB (ref 11.5–15.5)
WBC: 5.5 10*3/uL (ref 4.0–10.5)

## 2015-05-10 LAB — I-STAT TROPONIN, ED: Troponin i, poc: 0 ng/mL (ref 0.00–0.08)

## 2015-05-10 MED ORDER — METHOCARBAMOL 500 MG PO TABS: 500.0000 mg | ORAL_TABLET | Freq: Three times a day (TID) | ORAL | Status: DC | PRN

## 2015-05-10 MED ORDER — NAPROXEN 500 MG PO TABS: 500.0000 mg | ORAL_TABLET | Freq: Two times a day (BID) | ORAL | Status: DC

## 2015-05-10 MED ORDER — METHOCARBAMOL 500 MG PO TABS
1000.0000 mg | ORAL_TABLET | Freq: Once | ORAL | Status: AC
  Administered 2015-05-10: 1000 mg via ORAL
  Filled 2015-05-10: qty 2

## 2015-05-10 MED ORDER — IBUPROFEN 800 MG PO TABS
800.0000 mg | ORAL_TABLET | Freq: Once | ORAL | Status: AC
  Administered 2015-05-10: 800 mg via ORAL
  Filled 2015-05-10: qty 1

## 2015-05-10 NOTE — ED Provider Notes (Signed)
CSN: [id number]     Arrival date & time 05/10/15  1325 History   First MD Initiated Contact with Patient 05/10/15 1455     Chief Complaint  Patient presents with  . Chest Pain     HPI  She presents evaluation of left shoulder and chest pain. States it was sore a few weeks after moving her apartment. 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 Anterior aspect of her left shoulder from her chest to her shoulder. No difficult breathing. No cough or fever. No additional symptoms.  Past Medical History  Diagnosis Date  . Vertigo   . Headache(784.0)   . BV (bacterial vaginosis)   . UTI (lower urinary tract infection)   . Trichimoniasis   . Bilateral external ear infections   . Cervical incompetence affecting management of pregnancy, antepartum 11/10/2012  . Vaginal bleeding in pregnancy 11/10/2012  . Preterm uterine contractions, antepartum 11/10/2012   Past Surgical History  Procedure Laterality Date  . Cryoablation    . Cervical cerclage N/A 08/24/2012    Procedure: CERCLAGE CERVICAL;  Surgeon: Oliver PilaKathy W Richardson, MD;  Location: [facility];  Service: Gynecology;  Laterality: N/A;   Family History  Problem Relation Age of Onset  . Diabetes Mother    Social History  Substance Use Topics  . Smoking status: Never Smoker   . Smokeless tobacco: Never Used  . Alcohol Use: No   OB History    Gravida Para Term Preterm AB TAB SAB Ectopic Multiple Living   1 1  1      1      Review of Systems  Constitutional: Negative for fever, chills, diaphoresis, appetite change and fatigue.  HENT: Negative for mouth sores, sore throat and trouble swallowing.   Eyes: Negative for visual disturbance.  Respiratory: Negative for cough, chest tightness, shortness of breath and wheezing.   Cardiovascular: Positive for chest pain.  Gastrointestinal: Negative for nausea, vomiting, abdominal pain, diarrhea and abdominal distention.  Endocrine: Negative for  polydipsia, polyphagia and polyuria.  Genitourinary: Negative for dysuria, frequency and hematuria.  Musculoskeletal: Negative for gait problem.  Skin: Negative for color change, pallor and rash.  Neurological: Negative for dizziness, syncope, light-headedness and headaches.  Hematological: Does not bruise/bleed easily.  Psychiatric/Behavioral: Negative for behavioral problems and confusion.      Allergies  Review of patient's allergies indicates no known allergies.  Home Medications   Prior to Admission medications   Medication Sig Start Date End Date Taking? Authorizing Provider  Multiple Vitamin (MULTIVITAMIN WITH MINERALS) TABS tablet Take 1 tablet by mouth daily.   Yes Historical Provider, MD  methocarbamol (ROBAXIN) 500 MG tablet Take 1 tablet (500 mg total) by mouth 3 (three) times daily between meals as needed. 05/10/15   Rolland PorterMark Joniya Boberg, MD  metroNIDAZOLE (FLAGYL) 500 MG tablet Take 1 tablet (500 mg total) by mouth 2 (two) times daily. Patient not taking: Reported on 05/10/2015 02/20/15   Audry Piliyler Mohr, PA-C  naproxen (NAPROSYN) 500 MG tablet Take 1 tablet (500 mg total) by mouth 2 (two) times daily. 05/10/15   Rolland PorterMark Dreanna Kyllo, MD   BP 127/86 mmHg  Pulse 92  Temp(Src) 98.4 F (36.9 C) (Oral)  Resp 17  SpO2 98%  LMP 04/25/2015 Physical Exam  Constitutional: She is oriented to person, place, and time. She appears well-developed and well-nourished. No distress.  HENT:  Head: Normocephalic.  Eyes: Conjunctivae are normal. Pupils are equal, round, and reactive to light.  No scleral icterus.  Neck: Normal range of motion. Neck supple. No thyromegaly present.  Cardiovascular: Normal rate and regular rhythm.  Exam reveals no gallop and no friction rub.   No murmur heard. Pulmonary/Chest: Effort normal and breath sounds normal. No respiratory distress. She has no wheezes. She has no rales.    Abdominal: Soft. Bowel sounds are normal. She exhibits no distension. There is no tenderness. There is  no rebound.  Musculoskeletal: Normal range of motion.  Neurological: She is alert and oriented to person, place, and time.  Skin: Skin is warm and dry. No rash noted.  Psychiatric: She has a normal mood and affect. Her behavior is normal.    ED Course  Procedures (including critical care time) Labs Review Labs Reviewed  BASIC METABOLIC PANEL - Abnormal; Notable for the following:    Calcium 8.8 (*)    All other components within normal limits  CBC - Abnormal; Notable for the following:    Hemoglobin 11.4 (*)    HCT 34.7 (*)    MCV 77.1 (*)    MCH 25.3 (*)    RDW 16.4 (*)    All other components within normal limits  Rosezena Sensor, ED    Imaging Review Dg Chest 2 View  05/10/2015  CLINICAL DATA:  Chest pain EXAM: CHEST  2 VIEW COMPARISON:  03/12/2014 chest radiograph. FINDINGS: Low lung volumes. Stable cardiomediastinal silhouette with top-normal heart size. No pneumothorax. No pleural effusion. Lungs appear clear, with no acute consolidative airspace disease and no pulmonary edema. IMPRESSION: Limited hypoinspiratory chest radiographs with no active disease. Electronically Signed   By: Delbert Phenix M.D.   On: 05/10/2015 14:46   I have personally reviewed and evaluated these images and lab results as part of my medical decision-making.   EKG Interpretation   Date/Time:  Saturday May 10 2015 13:41:41 EDT Ventricular Rate:  98 PR Interval:  130 QRS Duration: 79 QT Interval:  345 QTC Calculation: 440 R Axis:   45 Text Interpretation:  Sinus rhythm No acute changes. No change v. recent  Confirmed by Fayrene Fearing  MD, Annalisse Minkoff ([id number]) on 05/10/2015 2:57:29 PM      MDM   Final diagnoses:  Chest wall pain    Clinically, this is clearly chest wall pain. Low risk. Normal EKG and chest x-ray. Appropriate for outpatient treatment with anti-inflammatories and muscle relaxants.    Rolland Porter, MD 05/10/15 [phone]

## 2015-05-10 NOTE — ED Notes (Signed)
Bed: [id number] Expected date:  Expected time:  Means of arrival:  Comments: 35 yo F chest wall pain

## 2015-05-10 NOTE — ED Notes (Addendum)
Per EMS, pt from home.  Pt c/o chest pain.  Same occurred last Saturday.  Left side.  Better with external pressure.  HTN with hx of same.  No meds.  Shortness of breath.  100% ra.  Vitals: 148/100, hr 92, resp 24, 100% ra.  cbg 127

## 2015-05-10 NOTE — Discharge Instructions (Signed)

## 2015-05-10 NOTE — ED Notes (Signed)
Unable to get IV at this time.  1 failed attempt to rt hand

## 2015-09-16 ENCOUNTER — Ambulatory Visit (HOSPITAL_COMMUNITY)
Admission: EM | Admit: 2015-09-16 | Discharge: 2015-09-16 | Disposition: A | Discharge status: Home / Self Care | Payer: Medicaid Other | Attending: Family Medicine | Admitting: Family Medicine

## 2015-09-16 ENCOUNTER — Encounter (HOSPITAL_COMMUNITY): Payer: Self-pay | Admitting: Emergency Medicine

## 2015-09-16 DIAGNOSIS — N739 Female pelvic inflammatory disease, unspecified: Secondary | ICD-10-CM | POA: Insufficient documentation

## 2015-09-16 DIAGNOSIS — R102 Pelvic and perineal pain: Secondary | ICD-10-CM

## 2015-09-16 DIAGNOSIS — N72 Inflammatory disease of cervix uteri: Secondary | ICD-10-CM

## 2015-09-16 DIAGNOSIS — N730 Acute parametritis and pelvic cellulitis: Secondary | ICD-10-CM

## 2015-09-16 DIAGNOSIS — N898 Other specified noninflammatory disorders of vagina: Secondary | ICD-10-CM

## 2015-09-16 DIAGNOSIS — R1011 Right upper quadrant pain: Secondary | ICD-10-CM

## 2015-09-16 LAB — POCT PREGNANCY, URINE: PREG TEST UR: NEGATIVE

## 2015-09-16 LAB — POCT URINALYSIS DIP (DEVICE)
BILIRUBIN URINE: NEGATIVE
GLUCOSE, UA: NEGATIVE mg/dL
KETONES UR: NEGATIVE mg/dL
LEUKOCYTES UA: NEGATIVE
NITRITE: NEGATIVE
Protein, ur: NEGATIVE mg/dL
Specific Gravity, Urine: >=1.03 (ref 1.005–1.030)
Urobilinogen, UA: 0.2 mg/dL (ref 0.0–1.0)
pH: 5.5 (ref 5.0–8.0)

## 2015-09-16 MED ORDER — CEFTRIAXONE SODIUM 1 G IJ SOLR
0.5000 g | Freq: Once | INTRAMUSCULAR | Status: AC
  Administered 2015-09-16: 0.5 g via INTRAMUSCULAR

## 2015-09-16 MED ORDER — AZITHROMYCIN 250 MG PO TABS
1000.0000 mg | ORAL_TABLET | Freq: Once | ORAL | Status: AC
  Administered 2015-09-16: 1000 mg via ORAL

## 2015-09-16 MED ORDER — AZITHROMYCIN 250 MG PO TABS
ORAL_TABLET | ORAL | Status: AC
  Filled 2015-09-16: qty 4

## 2015-09-16 MED ORDER — ONDANSETRON HCL 4 MG PO TABS: 4.0000 mg | ORAL_TABLET | Freq: Four times a day (QID) | ORAL | 0 refills | Status: DC

## 2015-09-16 MED ORDER — CEFTRIAXONE SODIUM 1 G IJ SOLR
INTRAMUSCULAR | Status: AC
  Filled 2015-09-16: qty 10

## 2015-09-16 MED ORDER — METRONIDAZOLE 500 MG PO TABS: 500.0000 mg | ORAL_TABLET | Freq: Two times a day (BID) | ORAL | 0 refills | Status: DC

## 2015-09-16 MED ORDER — FLUCONAZOLE 150 MG PO TABS: ORAL_TABLET | ORAL | 0 refills | Status: DC

## 2015-09-16 MED ORDER — LIDOCAINE HCL (PF) 1 % IJ SOLN
INTRAMUSCULAR | Status: AC
  Filled 2015-09-16: qty 2

## 2015-09-16 NOTE — ED Triage Notes (Signed)
General abdominal pain and cramping.  Onset one week ago.  Vaginal discharge and odor to urine per patient.  Denies burning with urination.  Last bm was this morning and normal per patient

## 2015-09-16 NOTE — ED Provider Notes (Signed)
CSN: [id number]     Arrival date & time 09/16/15  1108 History   First MD Initiated Contact with Patient 09/16/15 1240     Chief Complaint  Patient presents with  . Abdominal Pain  . Vaginitis   (Consider location/radiation/quality/duration/timing/severity/associated sxs/prior Treatment) 35 year old morbidly obese female complaining of a malodorous vaginal discharge for 3 weeks and associated with pelvic pain. She is also complaining of right upper quadrant pain developed last week and has been constant. No radiation. 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 She has had some nausea and vomiting 3-4 ,2 days ago. She says she vomited a little bit once and had some spitting up today. When the patient stood up to place herself on the exam table she states that lifting her leg caused her to have pain in the pelvis.      Past Medical History:  Diagnosis Date  . Bilateral external ear infections   . BV (bacterial vaginosis)   . Cervical incompetence affecting management of pregnancy, antepartum 11/10/2012  . Headache(784.0)   . Preterm uterine contractions, antepartum 11/10/2012  . Trichimoniasis   . UTI (lower urinary tract infection)   . Vaginal bleeding in pregnancy 11/10/2012  . Vertigo    Past Surgical History:  Procedure Laterality Date  . CERVICAL CERCLAGE N/A 08/24/2012   Procedure: CERCLAGE CERVICAL;  Surgeon: Oliver Pila, MD;  Location: [facility];  Service: Gynecology;  Laterality: N/A;  . CRYOABLATION     Family History  Problem Relation Age of Onset  . Diabetes Mother    Social History  Substance Use Topics  . Smoking status: Never Smoker  . Smokeless tobacco: Never Used  . Alcohol use No   OB History    Gravida Para Term Preterm AB Living   1 1   1   1    SAB TAB Ectopic Multiple Live Births           1     Review of Systems  Constitutional: Positive for appetite change.  HENT: Negative.    Respiratory: Negative.   Cardiovascular: Negative for chest pain and leg swelling.  Gastrointestinal: Positive for abdominal pain, nausea and vomiting. Negative for blood in stool, constipation and diarrhea.  Genitourinary: Positive for pelvic pain and vaginal discharge.  Musculoskeletal: Negative.   Skin: Negative.     Allergies  Review of patient's allergies indicates no known allergies.  Home Medications   Prior to Admission medications   Medication Sig Start Date End Date Taking? Authorizing Provider  fluconazole (DIFLUCAN) 150 MG tablet 1 tab po x 1. May repeat in 72 hours if no improvement 09/16/15   Hayden Rasmussen, NP  methocarbamol (ROBAXIN) 500 MG tablet Take 1 tablet (500 mg total) by mouth 3 (three) times daily between meals as needed. 05/10/15   Rolland Porter, MD  metroNIDAZOLE (FLAGYL) 500 MG tablet Take 1 tablet (500 mg total) by mouth 2 (two) times daily. X 7 days 09/16/15   Hayden Rasmussen, NP  Multiple Vitamin (MULTIVITAMIN WITH MINERALS) TABS tablet Take 1 tablet by mouth daily.    Historical Provider, MD  naproxen (NAPROSYN) 500 MG tablet Take 1 tablet (500 mg total) by mouth 2 (two) times daily. 05/10/15   Rolland Porter, MD  ondansetron (ZOFRAN) 4 MG tablet Take 1 tablet (4 mg total) by mouth every 6 (six) hours. Prn n or v. 09/16/15   Hayden Rasmussen, NP   Meds Ordered and  Administered this Visit   Medications  azithromycin (ZITHROMAX) tablet 1,000 mg (not administered)  cefTRIAXone (ROCEPHIN) injection 0.5 g (not administered)    There were no vitals taken for this visit. No data found.   Physical Exam  Constitutional: She is oriented to person, place, and time. She appears well-developed and well-nourished. No distress.  HENT:  Head: Normocephalic and atraumatic.  Eyes: EOM are normal.  Neck: Normal range of motion. Neck supple.  Cardiovascular: Normal rate and regular rhythm.   Pulmonary/Chest: Effort normal and breath sounds normal. No respiratory distress.  Abdominal:  Soft. Bowel sounds are normal. She exhibits no mass. There is tenderness. There is no guarding.  Tenderness to the right upper quadrant. Equivocal Murphy sign. No other areas of tenderness.  Genitourinary:  Genitourinary Comments: Normal external female genitalia. No observed lesions. There is tenderness to the vulva. Clear mucoid discharge coating the vaginal walls as well as a small amount of thin gray discharge. The ectocervix is deeply erythematous and somewhat scarred. Due to body habitus the cervix is not palpable. However, using a large Q-tip and pressing on the ectocervix and the side of the cervix to move it around causes much cervical pain and tenderness.  Musculoskeletal: She exhibits no edema.  Neurological: She is alert and oriented to person, place, and time. She exhibits normal muscle tone.  Skin: Skin is warm and dry.  Psychiatric: She has a normal mood and affect.  Nursing note and vitals reviewed.   Urgent Care Course   Clinical Course    Procedures (including critical care time)  Labs Review Labs Reviewed  POCT URINALYSIS DIP (DEVICE) - Abnormal; Notable for the following:       Result Value   Hgb urine dipstick TRACE (*)    All other components within normal limits  POCT PREGNANCY, URINE  CERVICOVAGINAL ANCILLARY ONLY   Results for orders placed or performed during the hospital encounter of 09/16/15  POCT urinalysis dip (device)  Result Value Ref Range   Glucose, UA NEGATIVE NEGATIVE mg/dL   Bilirubin Urine NEGATIVE NEGATIVE   Ketones, ur NEGATIVE NEGATIVE mg/dL   Specific Gravity, Urine >=1.030 1.005 - 1.030   Hgb urine dipstick TRACE (A) NEGATIVE   pH 5.5 5.0 - 8.0   Protein, ur NEGATIVE NEGATIVE mg/dL   Urobilinogen, UA 0.2 0.0 - 1.0 mg/dL   Nitrite NEGATIVE NEGATIVE   Leukocytes, UA NEGATIVE NEGATIVE  Pregnancy, urine POC  Result Value Ref Range   Preg Test, Ur NEGATIVE NEGATIVE     Imaging Review No results found.   Visual Acuity  Review  Right Eye Distance:   Left Eye Distance:   Bilateral Distance:    Right Eye Near:   Left Eye Near:    Bilateral Near:         MDM   1. RUQ abdominal pain   2. Pelvic pain in female   3. PID (acute pelvic inflammatory disease)   4. Cervicitis   5. Vaginal discharge    You are being treated for an infection in your pelvic organs. We have obtained swabs to test for specific organisms to identify as the reason for ear infection. You are given antibiotics today. You may also have problems with your gallbladder. You need additional testing such as blood work and an ultrasound to help diagnose this. Since she do not have a primary care doctor you are being referred to a Careers adviser. Call the number for an appointment. If your abdominal pain gets worse, develop  fever, increased vomiting, unable to hold down liquids or worsening go to the emergency department promptly. Cervical cytology pending Meds ordered this encounter  Medications  . azithromycin (ZITHROMAX) tablet 1,000 mg  . cefTRIAXone (ROCEPHIN) injection 0.5 g  . metroNIDAZOLE (FLAGYL) 500 MG tablet    Sig: Take 1 tablet (500 mg total) by mouth 2 (two) times daily. X 7 days    Dispense:  14 tablet    Refill:  0    Order Specific Question:   Supervising Provider    Answer:   Linna HoffKINDL, JAMES D [phone][[id number]]  . fluconazole (DIFLUCAN) 150 MG tablet    Sig: 1 tab po x 1. May repeat in 72 hours if no improvement    Dispense:  2 tablet    Refill:  0    Order Specific Question:   Supervising Provider    Answer:   Linna HoffKINDL, JAMES D [phone][[id number]]  . ondansetron (ZOFRAN) 4 MG tablet    Sig: Take 1 tablet (4 mg total) by mouth every 6 (six) hours. Prn n or v.    Dispense:  12 tablet    Refill:  0    Order Specific Question:   Supervising Provider    Answer:   Linna HoffKINDL, JAMES D [[id number]]       Hayden Rasmussenavid Camden Mazzaferro, NP 09/16/15 1402

## 2015-09-16 NOTE — Discharge Instructions (Signed)
You are Being treated for an infection in your pelvic organs. We have obtained swabs to test for specific organisms to identify as the reason for ear infection. You are given antibiotics today. You may also have problems with your gallbladder. You need additional testing such as blood work and an ultrasound to help diagnose this. Since she do not have a primary care doctor you are being referred to a Careers advisersurgeon. Call the number for an appointment. If your abdominal pain gets worse, develop fever, increased vomiting, unable to hold down liquids or worsening go to the emergency department promptly.

## 2015-09-17 LAB — CERVICOVAGINAL ANCILLARY ONLY
Chlamydia: NEGATIVE
Neisseria Gonorrhea: NEGATIVE
WET PREP (BD AFFIRM): NEGATIVE

## 2016-07-10 ENCOUNTER — Emergency Department (HOSPITAL_COMMUNITY)
Admission: EM | Admit: 2016-07-10 | Discharge: 2016-07-10 | Disposition: A | Discharge status: Home / Self Care | Payer: Medicaid Other | Attending: Emergency Medicine | Admitting: Emergency Medicine

## 2016-07-10 ENCOUNTER — Encounter (HOSPITAL_COMMUNITY): Payer: Self-pay

## 2016-07-10 DIAGNOSIS — K029 Dental caries, unspecified: Secondary | ICD-10-CM | POA: Insufficient documentation

## 2016-07-10 DIAGNOSIS — Z79899 Other long term (current) drug therapy: Secondary | ICD-10-CM | POA: Insufficient documentation

## 2016-07-10 MED ORDER — PENICILLIN V POTASSIUM 500 MG PO TABS: 500.0000 mg | ORAL_TABLET | Freq: Four times a day (QID) | ORAL | 0 refills | Status: DC

## 2016-07-10 MED ORDER — PENICILLIN V POTASSIUM 250 MG PO TABS
500.0000 mg | ORAL_TABLET | Freq: Once | ORAL | Status: AC
  Administered 2016-07-10: 500 mg via ORAL
  Filled 2016-07-10: qty 2

## 2016-07-10 MED ORDER — IBUPROFEN 800 MG PO TABS: 800.0000 mg | ORAL_TABLET | Freq: Three times a day (TID) | ORAL | 0 refills | Status: DC | PRN

## 2016-07-10 NOTE — ED Provider Notes (Signed)
TIME SEEN: 7:28 AM  CHIEF COMPLAINT: Right lower dental pain  HPI: Patient is a 36 year old female with no significant past medical history who presents emergency department with right lower dental pain for the past several days. She has had some associated facial swelling. No difficulty speaking, swallowing or breathing. No fevers or chills. No vomiting. Has been taking ibuprofen 800 mg  which she states completely resolves her pain.  ROS: See HPI Constitutional: no fever  Eyes: no drainage  ENT: no runny nose   Cardiovascular:  no chest pain  Resp: no SOB  GI: no vomiting GU: no dysuria Integumentary: no rash  Allergy: no hives  Musculoskeletal: no leg swelling  Neurological: no slurred speech ROS otherwise negative  PAST MEDICAL HISTORY/PAST SURGICAL HISTORY:  Past Medical History:  Diagnosis Date  . Bilateral external ear infections   . BV (bacterial vaginosis)   . Cervical incompetence affecting management of pregnancy, antepartum 11/10/2012  . Headache(784.0)   . Preterm uterine contractions, antepartum 11/10/2012  . Trichimoniasis   . UTI (lower urinary tract infection)   . Vaginal bleeding in pregnancy 11/10/2012  . Vertigo     MEDICATIONS:  Prior to Admission medications   Medication Sig Start Date End Date Taking? Authorizing Provider  fluconazole (DIFLUCAN) 150 MG tablet 1 tab po x 1. May repeat in 72 hours if no improvement 09/16/15   Hayden Rasmussen, NP  methocarbamol (ROBAXIN) 500 MG tablet Take 1 tablet (500 mg total) by mouth 3 (three) times daily between meals as needed. 05/10/15   Rolland Porter, MD  metroNIDAZOLE (FLAGYL) 500 MG tablet Take 1 tablet (500 mg total) by mouth 2 (two) times daily. X 7 days 09/16/15   Hayden Rasmussen, NP  Multiple Vitamin (MULTIVITAMIN WITH MINERALS) TABS tablet Take 1 tablet by mouth daily.    [provider]  naproxen (NAPROSYN) 500 MG tablet Take 1 tablet (500 mg total) by mouth 2 (two) times daily. 05/10/15   Rolland Porter, MD   ondansetron (ZOFRAN) 4 MG tablet Take 1 tablet (4 mg total) by mouth every 6 (six) hours. Prn n or v. 09/16/15   Hayden Rasmussen, NP    ALLERGIES:  No Known Allergies  SOCIAL HISTORY:  Social History  Substance Use Topics  . Smoking status: Never Smoker  . Smokeless tobacco: Never Used  . Alcohol use No    FAMILY HISTORY: Family History  Problem Relation Age of Onset  . Diabetes Mother     EXAM: BP (!) 147/101   Pulse 76   Temp 98 F (36.7 C)   Resp 18   LMP 07/02/2016 (Approximate)   SpO2 99%  CONSTITUTIONAL: Alert and oriented and responds appropriately to questions. Well-appearing; well-nourished HEAD: Normocephalic EYES: Conjunctivae clear, pupils appear equal, EOMI ENT: normal nose; moist mucous membranes; No pharyngeal erythema or petechiae, no tonsillar hypertrophy or exudate, no uvular deviation, no unilateral swelling, no trismus or drooling, no muffled voice, normal phonation, no stridor, patient does have a decayed right lower premolar that is decayed all the way to the level of the gumline but no drainable dental abscess noted, no Ludwig's angina, tongue sits flat in the bottom of the mouth, no angioedema, no facial erythema or warmth, no facial swelling; no pain with movement of the neck. NECK: Supple, no meningismus, no nuchal rigidity, no LAD  CARD: RRR; S1 and S2 appreciated; no murmurs, no clicks, no rubs, no gallops RESP: Normal chest excursion without splinting or tachypnea; breath sounds clear and equal bilaterally; no  wheezes, no rhonchi, no rales, no hypoxia or respiratory distress, speaking full sentences ABD/GI: Normal bowel sounds; non-distended; soft, non-tender, no rebound, no guarding, no peritoneal signs, no hepatosplenomegaly BACK:  The back appears normal and is non-tender to palpation, there is no CVA tenderness EXT: Normal ROM in all joints; non-tender to palpation; no edema; normal capillary refill; no cyanosis, no calf tenderness or swelling     SKIN: Normal color for age and race; warm; no rash NEURO: Moves all extremities equally PSYCH: The patient's mood and manner are appropriate. Grooming and personal hygiene are appropriate.  MEDICAL DECISION MAKING: Patient here with dental caries and likely dental abscess. No drainable abscess on exam. No Ludwig's angina. Normal phonation. Swallowing normally. No trismus or drooling. No hypoxia or respiratory distress. No stridor. We'll discharge home with outpatient dental follow-up. Have advised her to continue ibuprofen and she may alternate this with Tylenol and use over-the-counter Orajel. Will discharge on penicillin. Discussed return precautions. She is comfortable with this plan.   At this time, I do not feel there is any life-threatening condition present. I have reviewed and discussed all results (EKG, imaging, lab, urine as appropriate) and exam findings with patient/family. I have reviewed nursing notes and appropriate previous records.  I feel the patient is safe to be discharged home without further emergent workup and can continue workup as an outpatient as needed. Discussed usual and customary return precautions. Patient/family verbalize understanding and are comfortable with this plan.  Outpatient follow-up has been provided if needed. All questions have been answered.      Dorien Mayotte, Layla MawKristen N, DO 07/10/16 0800

## 2016-07-10 NOTE — ED Triage Notes (Signed)
Pt complaining of R lower dental pain. Pt states onset x 1 day. Pt states swelling began this afternoon.

## 2016-07-10 NOTE — Discharge Instructions (Signed)
You may alternate Tylenol 1000 mg every 6 hours as needed for pain and Ibuprofen 800 mg every 8 hours as needed for pain.  Please take Ibuprofen with food. ° °

## 2016-09-20 ENCOUNTER — Other Ambulatory Visit: Payer: Self-pay

## 2016-09-20 ENCOUNTER — Encounter (HOSPITAL_COMMUNITY): Payer: Self-pay | Admitting: Emergency Medicine

## 2016-09-20 ENCOUNTER — Emergency Department (HOSPITAL_COMMUNITY): Payer: Self-pay

## 2016-09-20 ENCOUNTER — Emergency Department (HOSPITAL_COMMUNITY)
Admission: EM | Admit: 2016-09-20 | Discharge: 2016-09-20 | Disposition: A | Discharge status: Home / Self Care | Payer: Self-pay | Attending: Emergency Medicine | Admitting: Emergency Medicine

## 2016-09-20 DIAGNOSIS — R05 Cough: Secondary | ICD-10-CM | POA: Insufficient documentation

## 2016-09-20 DIAGNOSIS — Z79899 Other long term (current) drug therapy: Secondary | ICD-10-CM | POA: Insufficient documentation

## 2016-09-20 DIAGNOSIS — R202 Paresthesia of skin: Secondary | ICD-10-CM | POA: Insufficient documentation

## 2016-09-20 DIAGNOSIS — R0602 Shortness of breath: Secondary | ICD-10-CM | POA: Insufficient documentation

## 2016-09-20 LAB — COMPREHENSIVE METABOLIC PANEL
ALBUMIN: 3.7 g/dL (ref 3.5–5.0)
ALK PHOS: 53 U/L (ref 38–126)
ALT: 16 U/L (ref 14–54)
AST: 19 U/L (ref 15–41)
Anion gap: 6 (ref 5–15)
BUN: 8 mg/dL (ref 6–20)
CHLORIDE: 105 mmol/L (ref 101–111)
CO2: 25 mmol/L (ref 22–32)
Calcium: 8.9 mg/dL (ref 8.9–10.3)
Creatinine, Ser: 0.7 mg/dL (ref 0.44–1.00)
GFR calc Af Amer: >60 mL/min (ref >60)
GFR calc non Af Amer: >60 mL/min (ref >60)
GLUCOSE: 115 mg/dL — AB (ref 65–99)
Potassium: 3.8 mmol/L (ref 3.5–5.1)
SODIUM: 136 mmol/L (ref 135–145)
Total Bilirubin: 0.2 mg/dL — ABNORMAL LOW (ref 0.3–1.2)
Total Protein: 6.7 g/dL (ref 6.5–8.1)

## 2016-09-20 LAB — I-STAT TROPONIN, ED: Troponin i, poc: 0.01 ng/mL (ref 0.00–0.08)

## 2016-09-20 LAB — CBC WITH DIFFERENTIAL/PLATELET
Basophils Absolute: 0 10*3/uL (ref 0.0–0.1)
Basophils Relative: 0 %
EOS PCT: 1 %
Eosinophils Absolute: 0.1 10*3/uL (ref 0.0–0.7)
HCT: 34.4 % — ABNORMAL LOW (ref 36.0–46.0)
Hemoglobin: 10.9 g/dL — ABNORMAL LOW (ref 12.0–15.0)
LYMPHS ABS: 1.8 10*3/uL (ref 0.7–4.0)
LYMPHS PCT: 36 %
MCH: 25 pg — ABNORMAL LOW (ref 26.0–34.0)
MCHC: 31.7 g/dL (ref 30.0–36.0)
MCV: 78.9 fL (ref 78.0–100.0)
MONO ABS: 0.2 10*3/uL (ref 0.1–1.0)
MONOS PCT: 5 %
Neutro Abs: 2.9 10*3/uL (ref 1.7–7.7)
Neutrophils Relative %: 58 %
PLATELETS: 205 10*3/uL (ref 150–400)
RBC: 4.36 MIL/uL (ref 3.87–5.11)
RDW: 16.2 % — ABNORMAL HIGH (ref 11.5–15.5)
WBC: 5 10*3/uL (ref 4.0–10.5)

## 2016-09-20 LAB — D-DIMER, QUANTITATIVE: D-Dimer, Quant: 0.44 ug/mL-FEU (ref 0.00–0.50)

## 2016-09-20 LAB — I-STAT BETA HCG BLOOD, ED (MC, WL, AP ONLY): I-stat hCG, quantitative: <5 m[IU]/mL (ref <5)

## 2016-09-20 MED ORDER — BENZONATATE 100 MG PO CAPS
100.0000 mg | ORAL_CAPSULE | Freq: Once | ORAL | Status: AC
  Administered 2016-09-20: 100 mg via ORAL
  Filled 2016-09-20: qty 1

## 2016-09-20 MED ORDER — SODIUM CHLORIDE 0.9 % IV BOLUS (SEPSIS): 1000.0000 mL | Freq: Once | INTRAVENOUS | Status: DC

## 2016-09-20 MED ORDER — BENZONATATE 100 MG PO CAPS: 100.0000 mg | ORAL_CAPSULE | Freq: Three times a day (TID) | ORAL | 0 refills | Status: DC

## 2016-09-20 MED ORDER — METOCLOPRAMIDE HCL 5 MG/ML IJ SOLN
10.0000 mg | Freq: Once | INTRAMUSCULAR | Status: AC
  Administered 2016-09-20: 10 mg via INTRAVENOUS
  Filled 2016-09-20: qty 2

## 2016-09-20 MED ORDER — PREDNISONE 20 MG PO TABS: 40.0000 mg | ORAL_TABLET | Freq: Every day | ORAL | 0 refills | Status: DC

## 2016-09-20 MED ORDER — DIPHENHYDRAMINE HCL 50 MG/ML IJ SOLN
12.5000 mg | Freq: Once | INTRAMUSCULAR | Status: AC
  Administered 2016-09-20: 12.5 mg via INTRAVENOUS
  Filled 2016-09-20: qty 1

## 2016-09-20 MED ORDER — PREDNISONE 20 MG PO TABS
60.0000 mg | ORAL_TABLET | Freq: Once | ORAL | Status: AC
  Administered 2016-09-20: 60 mg via ORAL
  Filled 2016-09-20: qty 3

## 2016-09-20 NOTE — Discharge Planning (Signed)
[facility].  PCP offices closed today due to holiday.  EDCM advised [facility] to [facility] to set up appointment for PCP establishment.

## 2016-09-20 NOTE — ED Notes (Addendum)
Ambulated the pt down the hallway and back.  Pt's SpO2 did not drop below 97% while ambulating.

## 2016-09-20 NOTE — ED Notes (Signed)
Patient transported to X-ray 

## 2016-09-20 NOTE — ED Notes (Addendum)
Applied wrist splint to left wrist.  Instructed the pt how to apply the splint.  She acknowledges understanding.

## 2016-09-20 NOTE — ED Provider Notes (Signed)
[facility] Provider Note   CSN: [id number] Arrival date & time: 09/20/16  4098     History   Chief Complaint Chief Complaint  Patient presents with  . Shortness of Breath    HPI Priscilla Powell is a 36 y.o. female.  HPI 36 year old African-American femalewith no significant past medical history presents to the emergency department today with complaints of shortness of breath. The patient states that her symptoms started yesterday. States that it is worse with exertion. States that his heart attack a deep breath.Patient denies any pleuritic chest pain. Patient denies any wheezing. Does endorse associated nonproductive cough. States that the shortness of breath is improved at rest. The patient denies any fevers, chills, emesis. He does report some mild nausea. Denies any diaphoresis. The patient is not taking the first symptoms prior to arrival.  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 Not currently on anticoagulation is. Patient denies any cardiac history. Denies any significant family cardiac history. Patient denies any history of hypertension, diabetes, high cholesterol. She denies any tobacco use. Patient denies any recent hospitalizations/surgeries, prolonged immobilizations, lower extremity edema or calf tenderness, OCP use.  Of note patient also complains of bilateral fingertip "tingling". She reports is pins and needle sensation.States this is an ongoing for 2 weeks and intermittent. Worse at night. Has not taken any for her symptoms. Nothing makes better or worse.  Pt denies any fever, chill, ha, vision changes, lightheadedness, dizziness, congestion, neck pain, cp, abd pain, v/d, urinary symptoms, change in bowel habits, melena, hematochezia, lower extremity paresthesias.  Past Medical History:  Diagnosis Date  . Bilateral external ear infections   . BV (bacterial vaginosis)   . Cervical incompetence affecting management of pregnancy, antepartum 11/10/2012    . Headache(784.0)   . Preterm uterine contractions, antepartum 11/10/2012  . Trichimoniasis   . UTI (lower urinary tract infection)   . Vaginal bleeding in pregnancy 11/10/2012  . Vertigo     Patient Active Problem List   Diagnosis Date Noted  . Preterm delivery 11/14/2012  . NSVD (normal spontaneous vaginal delivery) 11/14/2012  . DYSPNEA 09/13/2006  . SYMPTOM, PAIN, ABDOMINAL, EPIGASTRIC 09/13/2006  . HYPERTENSION, BENIGN ESSENTIAL 02/18/2006    Past Surgical History:  Procedure Laterality Date  . CERVICAL CERCLAGE N/A 08/24/2012   Procedure: CERCLAGE CERVICAL;  Surgeon: Oliver Pila, MD;  Location: [facility];  Service: Gynecology;  Laterality: N/A;  . CRYOABLATION      OB History    Gravida Para Term Preterm AB Living   1 1   1   1    SAB TAB Ectopic Multiple Live Births           1       Home Medications    Prior to Admission medications   Medication Sig Start Date End Date Taking? Authorizing Provider  fluconazole (DIFLUCAN) 150 MG tablet 1 tab po x 1. May repeat in 72 hours if no improvement 09/16/15   Hayden Rasmussen, NP  ibuprofen (ADVIL,MOTRIN) 800 MG tablet Take 1 tablet (800 mg total) by mouth every 8 (eight) hours as needed for mild pain. 07/10/16   Ward, Layla Maw, DO  methocarbamol (ROBAXIN) 500 MG tablet Take 1 tablet (500 mg total) by mouth 3 (three) times daily between meals as needed. 05/10/15   Rolland Porter, MD  metroNIDAZOLE (FLAGYL) 500 MG tablet Take 1 tablet (500 mg total) by mouth 2 (two) times daily. X 7 days 09/16/15   Hayden Rasmussen, NP  Multiple  Vitamin (MULTIVITAMIN WITH MINERALS) TABS tablet Take 1 tablet by mouth daily.    [provider]  naproxen (NAPROSYN) 500 MG tablet Take 1 tablet (500 mg total) by mouth 2 (two) times daily. 05/10/15   Rolland PorterJames, Mark, MD  ondansetron (ZOFRAN) 4 MG tablet Take 1 tablet (4 mg total) by mouth every 6 (six) hours. Prn n or v. 09/16/15   Hayden RasmussenMabe, David, NP  penicillin v potassium (VEETID) 500 MG tablet Take 1 tablet  (500 mg total) by mouth 4 (four) times daily. 07/10/16   Ward, Layla MawKristen N, DO    Family History Family History  Problem Relation Age of Onset  . Diabetes Mother     Social History Social History  Substance Use Topics  . Smoking status: Never Smoker  . Smokeless tobacco: Never Used  . Alcohol use No     Allergies   Patient has no known allergies.   Review of Systems Review of Systems  Constitutional: Negative for chills and fever.  HENT: Negative for congestion.   Eyes: Negative for visual disturbance.  Respiratory: Positive for cough and shortness of breath. Negative for wheezing.   Cardiovascular: Negative for chest pain, palpitations and leg swelling.  Gastrointestinal: Positive for nausea and vomiting. Negative for abdominal pain and diarrhea.  Genitourinary: Negative for dysuria, flank pain, frequency, hematuria, urgency, vaginal bleeding and vaginal discharge.  Musculoskeletal: Negative for arthralgias and myalgias.  Skin: Negative for rash.  Neurological: Negative for dizziness, syncope, weakness, light-headedness, numbness and headaches.       Paresthesias   Psychiatric/Behavioral: Negative for sleep disturbance. The patient is not nervous/anxious.      Physical Exam Updated Vital Signs BP 115/82 (BP Location: Right Arm)   Pulse 87   Temp 97.9 F (36.6 C) (Oral)   Resp 16   LMP 09/19/2016   SpO2 99%   Physical Exam  Constitutional: She is oriented to person, place, and time. She appears well-developed and well-nourished.  Non-toxic appearance. No distress.  HENT:  Head: Normocephalic and atraumatic.  Nose: Nose normal.  Mouth/Throat: Oropharynx is clear and moist.  Eyes: Pupils are equal, round, and reactive to light. Conjunctivae and EOM are normal. Right eye exhibits no discharge. Left eye exhibits no discharge.  Neck: Normal range of motion. Neck supple. No JVD present. No tracheal deviation present.  Cardiovascular: Normal rate, regular rhythm, normal  heart sounds and intact distal pulses.  Exam reveals no gallop and no friction rub.   No murmur heard. Pulmonary/Chest: Effort normal and breath sounds normal. No respiratory distress. She has no wheezes. She has no rales. She exhibits no tenderness.  No hypoxia or tachypnea,   Abdominal: Soft. Bowel sounds are normal. She exhibits no distension. There is no tenderness. There is no rebound and no guarding.  Obese abdomen   Musculoskeletal: Normal range of motion. She exhibits no tenderness.  No lower extremity edema or calf tenderness.  Lymphadenopathy:    She has no cervical adenopathy.  Neurological: She is alert and oriented to person, place, and time.  The patient is alert, attentive, and oriented x 3. Speech is clear. Cranial nerve II-VII grossly intact. Negative pronator drift. Sensation intact. Strength 5/5 in all extremities. Reflexes 2+ and symmetric at biceps, triceps, knees, and ankles. Rapid alternating movement and fine finger movements intact. Romberg is absent. Posture and gait normal.  Appropriate perception normal. The patient's tingling seems to be in distribution of median noted.  Positive phalen test that reproduces symptoms. Positive tinels sign.  Skin: Skin is warm and dry. Capillary refill takes less than 2 seconds. She is not diaphoretic.  Psychiatric: Her behavior is normal. Judgment and thought content normal.  Nursing note and vitals reviewed.    ED Treatments / Results  Labs (all labs ordered are listed, but only abnormal results are displayed) Labs Reviewed  COMPREHENSIVE METABOLIC PANEL - Abnormal; Notable for the following:       Result Value   Glucose, Bld 115 (*)    Total Bilirubin 0.2 (*)    All other components within normal limits  CBC WITH DIFFERENTIAL/PLATELET - Abnormal; Notable for the following:    Hemoglobin 10.9 (*)    HCT 34.4 (*)    MCH 25.0 (*)    RDW 16.2 (*)    All other components within normal limits  D-DIMER, QUANTITATIVE  (NOT AT [facility])  I-STAT TROPONIN, ED  I-STAT BETA HCG BLOOD, ED ([facility], [facility], [facility] ONLY)    EKG  EKG Interpretation None       Radiology Dg Chest 2 View  Result Date: 09/20/2016 CLINICAL DATA:  Tingling in bilateral arms/hands/fingertips x 2 weeks. SOB, N/V started earlier this morning with significant fatique. Hx HTN EXAM: CHEST  2 VIEW COMPARISON:  None. FINDINGS: Normal mediastinum and cardiac silhouette. Hypo inspiratory exam. Normal pulmonary vasculature. No evidence of effusion, infiltrate, or pneumothorax. No acute bony abnormality. IMPRESSION: Hypo inspiratory exam.  No acute cardiopulmonary process. Electronically Signed   By: Genevive Bi M.D.   On: 09/20/2016 07:43    Procedures Procedures (including critical care time)  Medications Ordered in ED Medications  metoCLOPramide (REGLAN) injection 10 mg (10 mg Intravenous Given 09/20/16 0751)  diphenhydrAMINE (BENADRYL) injection 12.5 mg (12.5 mg Intravenous Given 09/20/16 0751)  benzonatate (TESSALON) capsule 100 mg (100 mg Oral Given 09/20/16 0751)  predniSONE (DELTASONE) tablet 60 mg (60 mg Oral Given 09/20/16 0752)     Initial Impression / Assessment and Plan / ED Course  I have reviewed the triage vital signs and the nursing notes.  Pertinent labs & imaging results that were available during my care of the patient were reviewed by me and considered in my medical decision making (see chart for details).     Patient presents to the ED with complaints of shortness of breath and fingertip tingling. Patient denies any associated symptoms of fever, chills, URI symptoms, headache, vision changes, chest pain, abdominal pain.  Patient is overall well-appearing and nontoxic. Vital signs are reassuring. Physical exam is benign overall reassuring. No focal abnormalities noted. Patient has no focal neuro deficits. Neurovascularly in all extremities.  Lab work is reassuring.hemoglobin is at baseline.electrolytes are normal. The d-dimer is  negative. Troponin is negative. EKG shows normal sinus rhythm as reviewed by myself and attending. Chest x-ray was unremarkable for any focal infiltrate.  Patients shortness of breath does not seem to be cardiac or pulmonary etiology. Heart pathway score is 1. The patient's presentation does not seem to be concerning for PE, ACS, pneumonia. Unsure patient's etiologies. Patient does not appear fluid overloaded. Doubt CHF.lungs sounds are clear to auscultation bilaterally. Patient has been satting above 95% on room air in the ED at all times. Able to ambulate about 95%. Patient is not tachycardic.n reassessment the patient denies any symptoms at this time. She did ambulate without any shortness of breath.  Patient also complains of paresthesias to her bilateral fingertips.The patient has no focal neuro deficits. Low suspicion for CVA. This seems to appear to be in a median nerve distribution.  Presentation seems consistent with carpal tunnel. We'll give prednisone, wrist splints. Have consult case manager to provide resources for primary care follow-up.  Pt is hemodynamically stable, in NAD, & able to ambulate in the ED. Evaluation does not show pathology that would require ongoing emergent intervention or inpatient treatment. I explained the diagnosis to the patient. Pain has been managed & has no complaints prior to dc. Pt is comfortable with above plan and is stable for discharge at this time. All questions were answered prior to disposition. Strict return precautions for f/u to the ED were discussed. Encouraged follow up with PCP.   Final Clinical Impressions(s) / ED Diagnoses   Final diagnoses:  Shortness of breath  Paresthesias    New Prescriptions Discharge Medication List as of 09/20/2016  9:23 AM    START taking these medications   Details  benzonatate (TESSALON) 100 MG capsule Take 1 capsule (100 mg total) by mouth every 8 (eight) hours., Starting Mon 09/20/2016, Print    predniSONE  (DELTASONE) 20 MG tablet Take 2 tablets (40 mg total) by mouth daily with breakfast., Starting Mon 09/20/2016, Print         Rise Mu, PA-C 09/20/16 1002    Gilda Crease, MD 09/20/16 [phone]

## 2016-09-20 NOTE — ED Triage Notes (Signed)
Patient reports SOB with chest congestion and productive cough onset yesterday , denies fever or chills .

## 2016-09-20 NOTE — Discharge Instructions (Signed)
All of your workup and imaging has been reassuring. Unknown causing her symptoms. May be a viral infection. Please take the Tessalon for cough. Steam from a warm shower can help her shortness of breath.  In terms of your fingertip tingling seems consistent with possible carpal tunnel. When the splints at night. Take the prednisone starting tomorrow for the next 4 days. Follow-up with the primary care doctor. Have given you a number to call to help finding a primary care doctor in the area. It is important that she see them.  Return to the ED if he develops any worsening symptoms.

## 2016-10-22 ENCOUNTER — Encounter (HOSPITAL_COMMUNITY): Payer: Self-pay | Admitting: *Deleted

## 2016-10-22 ENCOUNTER — Ambulatory Visit (HOSPITAL_COMMUNITY)
Admission: EM | Admit: 2016-10-22 | Discharge: 2016-10-22 | Disposition: A | Discharge status: Home / Self Care | Payer: Self-pay | Attending: Physician Assistant | Admitting: Physician Assistant

## 2016-10-22 DIAGNOSIS — R5383 Other fatigue: Secondary | ICD-10-CM

## 2016-10-22 DIAGNOSIS — R110 Nausea: Secondary | ICD-10-CM

## 2016-10-22 DIAGNOSIS — G43001 Migraine without aura, not intractable, with status migrainosus: Secondary | ICD-10-CM

## 2016-10-22 DIAGNOSIS — R030 Elevated blood-pressure reading, without diagnosis of hypertension: Secondary | ICD-10-CM

## 2016-10-22 LAB — POCT I-STAT, CHEM 8
BUN: 11 mg/dL (ref 6–20)
CREATININE: 0.6 mg/dL (ref 0.44–1.00)
Calcium, Ion: 1.15 mmol/L (ref 1.15–1.40)
Chloride: 102 mmol/L (ref 101–111)
Glucose, Bld: 112 mg/dL — ABNORMAL HIGH (ref 65–99)
HCT: 39 % (ref 36.0–46.0)
HEMOGLOBIN: 13.3 g/dL (ref 12.0–15.0)
POTASSIUM: 3.7 mmol/L (ref 3.5–5.1)
Sodium: 138 mmol/L (ref 135–145)
TCO2: 25 mmol/L (ref 22–32)

## 2016-10-22 MED ORDER — ONDANSETRON HCL 4 MG PO TABS: 4.0000 mg | ORAL_TABLET | Freq: Three times a day (TID) | ORAL | 0 refills | Status: DC | PRN

## 2016-10-22 NOTE — Discharge Instructions (Signed)
Your labs look good. Medications for headaches will increase your BP, so therefore would stick with Tylenol ES for now. Zofran will help with Nausea. Try to get in with a PCP to discuss BP management. As you know a good diet low in fat and sodium is greatly important.

## 2016-10-22 NOTE — ED Triage Notes (Addendum)
Patient states increased blood pressure over the last couple days with dizziness. Patient states she does not have history of high blood pressure, does not take any meds for it. Patient states she has been checking it at work. Repots intermittent episodes of nausea. Patient reports headache, no relief with tylenol.

## 2016-10-22 NOTE — ED Provider Notes (Signed)
[facility]    CSN: [id number] Arrival date & time: 10/22/16  1746     History   Chief Complaint Chief Complaint  Patient presents with  . Hypertension    HPI Priscilla Powell is a 36 y.o. female.   Who presents with episodes of "high blood pressure". She states since Tuesday she has noted headaches which prompted her to take her BP. She states the diastolic was 110. She tried to take Aleve or Advil and the pressure would go up. She does not have a history of HTN. She does have a history of migraines but this headache feels "related to her BP". She does not have a PCP. She feels dizzy at times, but today that is improved. She is also nauseated at times.  See the remainder of full ROS in this setting.       Past Medical History:  Diagnosis Date  . Bilateral external ear infections   . BV (bacterial vaginosis)   . Cervical incompetence affecting management of pregnancy, antepartum 11/10/2012  . Headache(784.0)   . Preterm uterine contractions, antepartum 11/10/2012  . Trichimoniasis   . UTI (lower urinary tract infection)   . Vaginal bleeding in pregnancy 11/10/2012  . Vertigo     Patient Active Problem List   Diagnosis Date Noted  . Preterm delivery 11/14/2012  . NSVD (normal spontaneous vaginal delivery) 11/14/2012  . DYSPNEA 09/13/2006  . SYMPTOM, PAIN, ABDOMINAL, EPIGASTRIC 09/13/2006  . HYPERTENSION, BENIGN ESSENTIAL 02/18/2006    Past Surgical History:  Procedure Laterality Date  . CERVICAL CERCLAGE N/A 08/24/2012   Procedure: CERCLAGE CERVICAL;  Surgeon: Oliver Pila, MD;  Location: [facility];  Service: Gynecology;  Laterality: N/A;  . CRYOABLATION      OB History    Gravida Para Term Preterm AB Living   SAB TAB Ectopic Multiple Live Births           1       Home Medications    Prior to Admission medications   Medication Sig Start Date End Date Taking? Authorizing Provider  ibuprofen (ADVIL,MOTRIN) 800 MG tablet Take 1  tablet (800 mg total) by mouth every 8 (eight) hours as needed for mild pain. 07/10/16  Yes Ward, Layla Maw, DO  benzonatate (TESSALON) 100 MG capsule Take 1 capsule (100 mg total) by mouth every 8 (eight) hours. 09/20/16   Rise Mu, PA-C  fluconazole (DIFLUCAN) 150 MG tablet 1 tab po x 1. May repeat in 72 hours if no improvement 09/16/15   Hayden Rasmussen, NP  methocarbamol (ROBAXIN) 500 MG tablet Take 1 tablet (500 mg total) by mouth 3 (three) times daily between meals as needed. 05/10/15   Rolland Porter, MD  metroNIDAZOLE (FLAGYL) 500 MG tablet Take 1 tablet (500 mg total) by mouth 2 (two) times daily. X 7 days 09/16/15   Hayden Rasmussen, NP  Multiple Vitamin (MULTIVITAMIN WITH MINERALS) TABS tablet Take 1 tablet by mouth daily.    [provider]  naproxen (NAPROSYN) 500 MG tablet Take 1 tablet (500 mg total) by mouth 2 (two) times daily. 05/10/15   Rolland Porter, MD  ondansetron (ZOFRAN) 4 MG tablet Take 1 tablet (4 mg total) by mouth every 8 (eight) hours as needed for nausea or vomiting. 10/22/16   Riki Sheer, PA-C  penicillin v potassium (VEETID) 500 MG tablet Take 1 tablet (500 mg total) by mouth 4 (four) times daily. 07/10/16   Ward,  Kristen N, DO  predniSONE (DELTASONE) 20 MG tablet Take 2 tablets (40 mg total) by mouth daily with breakfast. 09/20/16   Leaphart, Lynann Beaver, PA-C    Family History Family History  Problem Relation Age of Onset  . Diabetes Mother     Social History Social History  Substance Use Topics  . Smoking status: Never Smoker  . Smokeless tobacco: Never Used  . Alcohol use No     Allergies   Patient has no known allergies.   Review of Systems Review of Systems  Constitutional: Negative for chills, diaphoresis and fever.  Respiratory: Negative for chest tightness and shortness of breath.   Cardiovascular: Negative for chest pain and leg swelling.  Neurological: Positive for dizziness and headaches. Negative for tremors, weakness, light-headedness  and numbness.  Psychiatric/Behavioral: Negative.      Physical Exam Triage Vital Signs ED Triage Vitals  Enc Vitals Group     BP 10/22/16 1810 117/84     Pulse Rate 10/22/16 1810 84     Resp 10/22/16 1810 17     Temp 10/22/16 1810 98.3 F (36.8 C)     Temp Source 10/22/16 1810 Oral     SpO2 10/22/16 1810 100 %     Weight --      Height --      Head Circumference --      Peak Flow --      Pain Score 10/22/16 1809 6     Pain Loc --      Pain Edu? --      Excl. in GC? --    No data found.   Updated Vital Signs BP 117/84 (BP Location: Left Arm)   Pulse 84   Temp 98.3 F (36.8 C) (Oral)   Resp 17   SpO2 100%   Visual Acuity Right Eye Distance:   Left Eye Distance:   Bilateral Distance:    Right Eye Near:   Left Eye Near:    Bilateral Near:     Physical Exam  Constitutional: She is oriented to person, place, and time. She appears well-developed and well-nourished. No distress.  HENT:  Head: Normocephalic and atraumatic.  Eyes: Pupils are equal, round, and reactive to light.  Neck: Normal range of motion.  Cardiovascular: Normal rate and regular rhythm.   Pulmonary/Chest: Effort normal.  Musculoskeletal: She exhibits no edema.  Neurological: She is alert and oriented to person, place, and time.  Skin: Skin is warm and dry. She is not diaphoretic.  Psychiatric: Her behavior is normal.  Nursing note and vitals reviewed.    UC Treatments / Results  Labs (all labs ordered are listed, but only abnormal results are displayed) Labs Reviewed  POCT I-STAT, CHEM 8 - Abnormal; Notable for the following:       Result Value   Glucose, Bld 112 (*)    All other components within normal limits    EKG  EKG Interpretation None       Radiology No results found.  Procedures Procedures (including critical care time)  Medications Ordered in UC Medications - No data to display   Initial Impression / Assessment and Plan / UC Course  I have reviewed the triage  vital signs and the nursing notes.  Pertinent labs & imaging results that were available during my care of the patient were reviewed by me and considered in my medical decision making (see chart for details).    No active elevated BP, no neuro deficit. Sharen Hones is performed  and normal. Exam is unremarkable. Encouraged low salt diet/weight loss and following up with a PCP for further evaluation of her BP. No emergent needs at this time.   Final Clinical Impressions(s) / UC Diagnoses   Final diagnoses:  Migraine without aura and with status migrainosus, not intractable  Elevated BP without diagnosis of hypertension  Fatigue, unspecified type  Nausea without vomiting    New Prescriptions Discharge Medication List as of 10/22/2016  7:38 PM       Controlled Substance Prescriptions [place] Controlled Substance Registry consulted? Not Applicable   Sharin Mons 10/22/16 2005

## 2017-01-14 ENCOUNTER — Ambulatory Visit: Payer: Self-pay | Admitting: Urgent Care

## 2017-01-15 ENCOUNTER — Ambulatory Visit: Payer: Self-pay | Admitting: Emergency Medicine

## 2017-01-19 ENCOUNTER — Other Ambulatory Visit: Payer: Self-pay

## 2017-01-19 ENCOUNTER — Emergency Department (HOSPITAL_COMMUNITY): Payer: Self-pay

## 2017-01-19 ENCOUNTER — Emergency Department (HOSPITAL_COMMUNITY)
Admission: EM | Admit: 2017-01-19 | Discharge: 2017-01-19 | Disposition: A | Discharge status: Home / Self Care | Payer: Self-pay | Attending: Emergency Medicine | Admitting: Emergency Medicine

## 2017-01-19 DIAGNOSIS — R0602 Shortness of breath: Secondary | ICD-10-CM | POA: Insufficient documentation

## 2017-01-19 DIAGNOSIS — Z79899 Other long term (current) drug therapy: Secondary | ICD-10-CM | POA: Insufficient documentation

## 2017-01-19 DIAGNOSIS — R0789 Other chest pain: Secondary | ICD-10-CM | POA: Insufficient documentation

## 2017-01-19 DIAGNOSIS — K0889 Other specified disorders of teeth and supporting structures: Secondary | ICD-10-CM | POA: Insufficient documentation

## 2017-01-19 LAB — BASIC METABOLIC PANEL
ANION GAP: 5 (ref 5–15)
BUN: 10 mg/dL (ref 6–20)
CHLORIDE: 105 mmol/L (ref 101–111)
CO2: 24 mmol/L (ref 22–32)
CREATININE: 0.67 mg/dL (ref 0.44–1.00)
Calcium: 8.6 mg/dL — ABNORMAL LOW (ref 8.9–10.3)
GFR calc non Af Amer: >60 mL/min (ref >60)
Glucose, Bld: 105 mg/dL — ABNORMAL HIGH (ref 65–99)
POTASSIUM: 3.8 mmol/L (ref 3.5–5.1)
SODIUM: 134 mmol/L — AB (ref 135–145)

## 2017-01-19 LAB — CBC
HCT: 33 % — ABNORMAL LOW (ref 36.0–46.0)
HEMOGLOBIN: 10.6 g/dL — AB (ref 12.0–15.0)
MCH: 25.5 pg — ABNORMAL LOW (ref 26.0–34.0)
MCHC: 32.1 g/dL (ref 30.0–36.0)
MCV: 79.3 fL (ref 78.0–100.0)
PLATELETS: 227 10*3/uL (ref 150–400)
RBC: 4.16 MIL/uL (ref 3.87–5.11)
RDW: 16.5 % — ABNORMAL HIGH (ref 11.5–15.5)
WBC: 4.8 10*3/uL (ref 4.0–10.5)

## 2017-01-19 LAB — I-STAT TROPONIN, ED
Troponin i, poc: 0 ng/mL (ref 0.00–0.08)
Troponin i, poc: 0 ng/mL (ref 0.00–0.08)

## 2017-01-19 LAB — I-STAT BETA HCG BLOOD, ED (MC, WL, AP ONLY)

## 2017-01-19 LAB — D-DIMER, QUANTITATIVE (NOT AT ARMC): D DIMER QUANT: 0.28 ug{FEU}/mL (ref 0.00–0.50)

## 2017-01-19 MED ORDER — FAMOTIDINE 20 MG PO TABS: 20.0000 mg | ORAL_TABLET | Freq: Two times a day (BID) | ORAL | 0 refills | Status: DC

## 2017-01-19 MED ORDER — PENICILLIN V POTASSIUM 500 MG PO TABS: 500.0000 mg | ORAL_TABLET | Freq: Three times a day (TID) | ORAL | 0 refills | Status: DC

## 2017-01-19 NOTE — Discharge Instructions (Signed)
Please read and follow all provided instructions.  Your diagnoses today include:  1. Atypical chest pain   2. Pain, dental     Tests performed today include:  An EKG of your heart  A chest x-ray  Cardiac enzymes - a blood test for heart muscle damage  Blood counts and electrolytes  D-dimer - test for blood clot  Vital signs. See below for your results today.   Medications prescribed:   Penicillin - antibiotic  You have been prescribed an antibiotic medicine: take the entire course of medicine even if you are feeling better. Stopping early can cause the antibiotic not to work.   Pepcid (famotidine) - antihistamine  You can find this medication over-the-counter.   DO NOT exceed:   20mg  Pepcid every 12 hours  Take any prescribed medications only as directed.  Follow-up instructions: Please follow-up with your primary care provider as soon as you can for further evaluation of your symptoms.   Return instructions:  SEEK IMMEDIATE MEDICAL ATTENTION IF:  You have severe chest pain, especially if the pain is crushing or pressure-like and spreads to the arms, back, neck, or jaw, or if you have sweating, nausea (feeling sick to your stomach), or shortness of breath. THIS IS AN EMERGENCY. Don't wait to see if the pain will go away. Get medical help at once. Call 911 or 0 (operator). DO NOT drive yourself to the hospital.   Your chest pain gets worse and does not go away with rest.   You have an attack of chest pain lasting longer than usual, despite rest and treatment with the medications your caregiver has prescribed.   You wake from sleep with chest pain or shortness of breath.  You feel dizzy or faint.  You have chest pain not typical of your usual pain for which you originally saw your caregiver.   You have any other emergent concerns regarding your health.  Additional Information: Chest pain comes from many different causes. Your caregiver has diagnosed you as  having chest pain that is not specific for one problem, but does not require admission.  You are at low risk for an acute heart condition or other serious illness.   Your vital signs today were: BP 118/72    Pulse 73    Temp 98.3 F (36.8 C) (Oral)    Resp 13    Ht 5' (1.524 m)    Wt (!) 138.3 kg (305 lb)    SpO2 100%    BMI 59.57 kg/m  If your blood pressure (BP) was elevated above 135/85 this visit, please have this repeated by your doctor within one month. --------------

## 2017-01-19 NOTE — ED Provider Notes (Signed)
[facility] EMERGENCY DEPARTMENT Provider Note   CSN: [id number] Arrival date & time: 01/19/17  0554     History   Chief Complaint Chief Complaint  Patient presents with  . Chest Pain    HPI Priscilla SalkMelissa Derwin is a 37 y.o. female.  Patient with history of blood clot in leg requiring anticoagulation (pt reported) presents with waxing and waning chest discomfort and racing heart over the past 4-5 days. She had a more severe episode while at work this morning at about 4:30am. She had associated SOB. She felt better with rest, but this episode prompted call to EMS.  ASA and nitroglycerin was administered.  Take symptoms ongoing for several days.  She reports taking a lot of Excedrin for left lower toothache without facial swelling and also having watery diarrhea that is now improved.  Her chest symptoms do not seem to be exacerbated or made better with food.  She has had no fevers or cough.  No associated diaphoresis.  She denies to me any history of hypertension, high cholesterol, diabetes, smoking, family history of coronary artery disease.  Patient denies risk factors for pulmonary embolism including: unilateral leg swelling, history of DVT/PE/other blood clots, use of exogenous hormones, recent immobilizations, recent surgery, recent travel (>4hr segment), malignancy, hemoptysis. The onset of this condition was acute.      Past Medical History:  Diagnosis Date  . Bilateral external ear infections   . BV (bacterial vaginosis)   . Cervical incompetence affecting management of pregnancy, antepartum 11/10/2012  . Headache(784.0)   . Preterm uterine contractions, antepartum 11/10/2012  . Trichimoniasis   . UTI (lower urinary tract infection)   . Vaginal bleeding in pregnancy 11/10/2012  . Vertigo     Patient Active Problem List   Diagnosis Date Noted  . Preterm delivery 11/14/2012  . NSVD (normal spontaneous vaginal delivery) 11/14/2012  . DYSPNEA 09/13/2006  .  SYMPTOM, PAIN, ABDOMINAL, EPIGASTRIC 09/13/2006  . HYPERTENSION, BENIGN ESSENTIAL 02/18/2006    Past Surgical History:  Procedure Laterality Date  . CERVICAL CERCLAGE N/A 08/24/2012   Procedure: CERCLAGE CERVICAL;  Surgeon: Oliver PilaKathy W Richardson, MD;  Location: [facility];  Service: Gynecology;  Laterality: N/A;  . CRYOABLATION      OB History    Gravida Para Term Preterm AB Living   1 1   1   1    SAB TAB Ectopic Multiple Live Births           1       Home Medications    Prior to Admission medications   Medication Sig Start Date End Date Taking? Authorizing Provider  benzonatate (TESSALON) 100 MG capsule Take 1 capsule (100 mg total) by mouth every 8 (eight) hours. 09/20/16   Rise MuLeaphart, Kenneth T, PA-C  fluconazole (DIFLUCAN) 150 MG tablet 1 tab po x 1. May repeat in 72 hours if no improvement 09/16/15   Hayden RasmussenMabe, David, NP  ibuprofen (ADVIL,MOTRIN) 800 MG tablet Take 1 tablet (800 mg total) by mouth every 8 (eight) hours as needed for mild pain. 07/10/16   Ward, Layla MawKristen N, DO  methocarbamol (ROBAXIN) 500 MG tablet Take 1 tablet (500 mg total) by mouth 3 (three) times daily between meals as needed. 05/10/15   Rolland PorterJames, Mark, MD  metroNIDAZOLE (FLAGYL) 500 MG tablet Take 1 tablet (500 mg total) by mouth 2 (two) times daily. X 7 days 09/16/15   Hayden RasmussenMabe, David, NP  Multiple Vitamin (MULTIVITAMIN WITH MINERALS) TABS tablet Take 1 tablet by mouth daily.  [provider]  naproxen (NAPROSYN) 500 MG tablet Take 1 tablet (500 mg total) by mouth 2 (two) times daily. 05/10/15   Rolland Porter, MD  ondansetron (ZOFRAN) 4 MG tablet Take 1 tablet (4 mg total) by mouth every 8 (eight) hours as needed for nausea or vomiting. 10/22/16   Riki Sheer, PA-C  penicillin v potassium (VEETID) 500 MG tablet Take 1 tablet (500 mg total) by mouth 4 (four) times daily. 07/10/16   Ward, Layla Maw, DO  predniSONE (DELTASONE) 20 MG tablet Take 2 tablets (40 mg total) by mouth daily with breakfast. 09/20/16   Leaphart, Lynann Beaver,  PA-C    Family History Family History  Problem Relation Age of Onset  . Diabetes Mother     Social History Social History   Tobacco Use  . Smoking status: Never Smoker  . Smokeless tobacco: Never Used  Substance Use Topics  . Alcohol use: No  . Drug use: No     Allergies   Patient has no known allergies.   Review of Systems Review of Systems  Constitutional: Negative for diaphoresis and fever.  HENT: Positive for dental problem. Negative for facial swelling.   Eyes: Negative for redness.  Respiratory: Positive for shortness of breath. Negative for cough.   Cardiovascular: Positive for chest pain and palpitations. Negative for leg swelling.  Gastrointestinal: Positive for diarrhea (improved) and nausea. Negative for abdominal pain and vomiting.  Genitourinary: Negative for dysuria.  Musculoskeletal: Negative for back pain and neck pain.  Skin: Negative for rash.  Neurological: Negative for syncope and light-headedness.  Psychiatric/Behavioral: The patient is not nervous/anxious.      Physical Exam Updated Vital Signs BP 105/67 (BP Location: Right Arm)   Pulse 76   Temp 98.3 F (36.8 C) (Oral)   Resp (!) 26   Ht 5' (1.524 m)   Wt (!) 138.3 kg (305 lb)   SpO2 100%   BMI 59.57 kg/m   Physical Exam  Constitutional: She appears well-developed and well-nourished.  HENT:  Head: Normocephalic and atraumatic.  Mouth/Throat: Mucous membranes are normal. Mucous membranes are not dry.  Eyes: Conjunctivae are normal.  Neck: Trachea normal and normal range of motion. Neck supple. Normal carotid pulses and no JVD present. No muscular tenderness present. Carotid bruit is not present. No tracheal deviation present.  Cardiovascular: Normal rate, regular rhythm, S1 normal, S2 normal, normal heart sounds and intact distal pulses. Exam reveals no decreased pulses.  No murmur heard. Pulmonary/Chest: Effort normal. No respiratory distress. She has no decreased breath sounds.  She has no wheezes. She has no rhonchi. She has no rales. She exhibits no tenderness.  Abdominal: Soft. Normal aorta and bowel sounds are normal. There is no tenderness. There is no rebound and no guarding.  Musculoskeletal: Normal range of motion.       Right lower leg: She exhibits no tenderness and no edema.       Left lower leg: She exhibits no tenderness and no edema.  No clinical signs of DVT.  Neurological: She is alert.  Skin: Skin is warm and dry. She is not diaphoretic. No cyanosis. No pallor.  Psychiatric: She has a normal mood and affect.  Nursing note and vitals reviewed.    ED Treatments / Results  Labs (all labs ordered are listed, but only abnormal results are displayed) Labs Reviewed  BASIC METABOLIC PANEL - Abnormal; Notable for the following components:      Result Value   Sodium 134 (*)  Glucose, Bld 105 (*)    Calcium 8.6 (*)    All other components within normal limits  CBC - Abnormal; Notable for the following components:   Hemoglobin 10.6 (*)    HCT 33.0 (*)    MCH 25.5 (*)    RDW 16.5 (*)    All other components within normal limits  D-DIMER, QUANTITATIVE (NOT AT [facility])  I-STAT TROPONIN, ED  I-STAT BETA HCG BLOOD, ED ([facility], [facility], [facility] ONLY)  I-STAT TROPONIN, ED   ED ECG REPORT   Date: 01/19/2017  Rate: 76  Rhythm: normal sinus rhythm  QRS Axis: normal  Intervals: normal  ST/T Wave abnormalities: normal  Conduction Disutrbances:none  Narrative Interpretation:   Old EKG Reviewed: unchanged  I have personally reviewed the EKG tracing and agree with the computerized printout as noted.  Radiology Dg Chest 2 View  Result Date: 01/19/2017 CLINICAL DATA:  Chest pain for several days EXAM: CHEST  2 VIEW COMPARISON:  09/20/2016 FINDINGS: Cardiac shadow is mildly enlarged but stable. The lungs are well aerated bilaterally. No focal infiltrate or sizable effusion is seen. No bony abnormality is noted. IMPRESSION: No active cardiopulmonary disease.  Electronically Signed   By: Alcide Clever M.D.   On: 01/19/2017 07:15    Procedures Procedures (including critical care time)  Medications Ordered in ED Medications - No data to display   Initial Impression / Assessment and Plan / ED Course  I have reviewed the triage vital signs and the nursing notes.  Pertinent labs & imaging results that were available during my care of the patient were reviewed by me and considered in my medical decision making (see chart for details).     Patient seen and examined. Work-up initiated. Will get d-dimer given patient reported history of DVT. HEART score = 2.   Vital signs reviewed and are as follows: BP 105/67 (BP Location: Right Arm)   Pulse 76   Temp 98.3 F (36.8 C) (Oral)   Resp (!) 26   Ht 5' (1.524 m)   Wt (!) 138.3 kg (305 lb)   SpO2 100%   BMI 59.57 kg/m   8:37 AM Patient stable. Will check delta trop. She states her heart feels like it is occasionally fluttering. She is 80's in NSR on monitor. Reassured patient. Plan d/c if delta is neg.   9:46 AM Repeat EKG unchanged.   10:07 AM repeat troponin is negative.  Patient informed of all results.  Plan: Penicillin for dental pain, encouraged dental follow-up.  Encourage PCP follow-up.  Pepcid for possible NSAID induced gastritis given recent NSAID use.  Patient was counseled to return with severe chest pain, especially if the pain is crushing or pressure-like and spreads to the arms, back, neck, or jaw, or if they have sweating, nausea, or shortness of breath with the pain. They were encouraged to call 911 with these symptoms.   They were also told to return if their chest pain gets worse and does not go away with rest, they have an attack of chest pain lasting longer than usual despite rest and treatment with the medications their caregiver has prescribed, if they wake from sleep with chest pain or shortness of breath, if they feel dizzy or faint, if they have chest pain not typical of  their usual pain, or if they have any other emergent concerns regarding their health.  The patient verbalized understanding and agreed.    Final Clinical Impressions(s) / ED Diagnoses   Final diagnoses:  Atypical  chest pain  Pain, dental   CP: Atypical in nature.  D-dimer negative.  Troponin negative x2 without EKG changes.  No signs of pneumonia on chest x-ray.  Do not suspect ACS, dissection, PE.  Patient recently admits to heavy NSAID use.  Although symptoms do not seem to be changed with food, she may have an element of gastritis and will treat appropriately.  Patient counseled on appropriate use of NSAIDs.  Dental pain: No gross abscess, tooth #18 is location of pain.  Treat with penicillin.  Patient to follow-up with dentist.  ED Discharge Orders        Ordered    penicillin v potassium (VEETID) 500 MG tablet  3 times daily     01/19/17 1002    famotidine (PEPCID) 20 MG tablet  2 times daily     01/19/17 1002       Renne Crigler, PA-C 01/19/17 1009    Azalia Bilis, MD 01/23/17 2312

## 2017-01-19 NOTE — ED Triage Notes (Signed)
[facility] C/o centralized CP since Saturday. Denies any other s/s @ this time. Given 324 ASA & 1 NTG. Rates pain @ 5/10. No cardiac hx. Pt A&O x4 on arrival.

## 2017-09-06 ENCOUNTER — Other Ambulatory Visit: Payer: Self-pay

## 2017-09-06 ENCOUNTER — Ambulatory Visit (HOSPITAL_COMMUNITY)
Admission: EM | Admit: 2017-09-06 | Discharge: 2017-09-06 | Disposition: A | Discharge status: Home / Self Care | Payer: Self-pay | Attending: Family Medicine | Admitting: Family Medicine

## 2017-09-06 ENCOUNTER — Encounter (HOSPITAL_COMMUNITY): Payer: Self-pay

## 2017-09-06 ENCOUNTER — Ambulatory Visit (INDEPENDENT_AMBULATORY_CARE_PROVIDER_SITE_OTHER): Payer: Self-pay

## 2017-09-06 ENCOUNTER — Ambulatory Visit (HOSPITAL_COMMUNITY)
Admission: RE | Admit: 2017-09-06 | Discharge: 2017-09-06 | Disposition: A | Discharge status: Home / Self Care | Payer: Medicaid Other | Source: Ambulatory Visit | Attending: Family Medicine | Admitting: Family Medicine

## 2017-09-06 DIAGNOSIS — R1010 Upper abdominal pain, unspecified: Secondary | ICD-10-CM | POA: Insufficient documentation

## 2017-09-06 DIAGNOSIS — R932 Abnormal findings on diagnostic imaging of liver and biliary tract: Secondary | ICD-10-CM | POA: Insufficient documentation

## 2017-09-06 LAB — POCT URINALYSIS DIP (DEVICE)
Bilirubin Urine: NEGATIVE
Glucose, UA: NEGATIVE mg/dL
Ketones, ur: NEGATIVE mg/dL
NITRITE: NEGATIVE
PH: 5.5 (ref 5.0–8.0)
Protein, ur: NEGATIVE mg/dL
SPECIFIC GRAVITY, URINE: 1.025 (ref 1.005–1.030)
Urobilinogen, UA: 0.2 mg/dL (ref 0.0–1.0)

## 2017-09-06 LAB — POCT PREGNANCY, URINE: Preg Test, Ur: NEGATIVE

## 2017-09-06 MED ORDER — ONDANSETRON 4 MG PO TBDP
4.0000 mg | ORAL_TABLET | Freq: Once | ORAL | Status: AC
  Administered 2017-09-06: 4 mg via ORAL

## 2017-09-06 MED ORDER — ONDANSETRON 4 MG PO TBDP
ORAL_TABLET | ORAL | Status: AC
Start: 2017-09-06 — End: ?
  Filled 2017-09-06: qty 1

## 2017-09-06 NOTE — ED Provider Notes (Addendum)
[facility]    CSN: [id number] Arrival date & time: 09/06/17  [id number]     [id number]   Chief Complaint Chief Complaint  Patient presents with  . Abdominal Pain    HPI Priscilla Powell is a 37 y.o. female.   Patient is a 37 year old female with past medical history of BV, cervical incompetence, headache, trichomoniasis, UTI, vertigo.  She presents with upper abdominal cramping since 730 this morning when she was at work.  She had 2 episodes of vomiting and some diarrhea.  Loose stools not watery.  Denies any blood in stool.  She had a similar episode like this a week ago that lasted about 45 minutes and then dissipated.  She denies any vaginal discharge, bleeding, dysuria, hematuria, frequency.  Her last menstrual period ended on Monday.  It was 4 days with normal flow.  She any recent sick contacts, recent trave, insect bites or rashes. Last BM was yesterday and normal.   She has had issues with her gallbladder in the past.  She does not smoke cigarettes or drink alcohol.  ROS per HPI         Past Medical History:  Diagnosis Date  . Bilateral external ear infections   . BV (bacterial vaginosis)   . Cervical incompetence affecting management of pregnancy, antepartum 11/10/2012  . Headache(784.0)   . Preterm uterine contractions, antepartum 11/10/2012  . Trichimoniasis   . UTI (lower urinary tract infection)   . Vaginal bleeding in pregnancy 11/10/2012  . Vertigo     Patient Active Problem List   Diagnosis Date Noted  . Preterm delivery 11/14/2012  . NSVD (normal spontaneous vaginal delivery) 11/14/2012  . DYSPNEA 09/13/2006  . SYMPTOM, PAIN, ABDOMINAL, EPIGASTRIC 09/13/2006  . HYPERTENSION, BENIGN ESSENTIAL 02/18/2006    Past Surgical History:  Procedure Laterality Date  . CERVICAL CERCLAGE N/A 08/24/2012   Procedure: CERCLAGE CERVICAL;  Surgeon: Oliver PilaKathy W Richardson, MD;  Location: [facility];  Service: Gynecology;  Laterality: N/A;  . CRYOABLATION      OB  History    Gravida  1   Para  1   Term      Preterm  1   AB      Living  1     SAB      TAB      Ectopic      Multiple      Live Births  1            Home Medications    Prior to Admission medications   Medication Sig Start Date End Date Taking? Authorizing Provider  acetaminophen (TYLENOL) 325 MG tablet Take 650 mg by mouth every 6 (six) hours as needed for fever.    [provider]  famotidine (PEPCID) 20 MG tablet Take 1 tablet (20 mg total) by mouth 2 (two) times daily. Patient not taking: Reported on 09/06/2017 01/19/17   Renne CriglerGeiple, Joshua, PA-C  penicillin v potassium (VEETID) 500 MG tablet Take 1 tablet (500 mg total) by mouth 3 (three) times daily. Patient not taking: Reported on 09/06/2017 01/19/17   Renne CriglerGeiple, Joshua, PA-C    Family History Family History  Problem Relation Age of Onset  . Diabetes Mother     Social History Social History   Tobacco Use  . Smoking status: Never Smoker  . Smokeless tobacco: Never Used  Substance Use Topics  . Alcohol use: No  . Drug use: No     Allergies   Patient has no known allergies.  Review of Systems Review of Systems   Physical Exam Triage Vital Signs ED Triage Vitals  Enc Vitals Group     BP 09/06/17 0955 (!) 147/97     Pulse Rate 09/06/17 0955 82     Resp 09/06/17 0955 20     Temp 09/06/17 0954 98 F (36.7 C)     Temp Source 09/06/17 0954 Oral     SpO2 09/06/17 0955 100 %     Weight 09/06/17 0956 (!) 305 lb (138.3 kg)     Height --      Head Circumference --      Peak Flow --      Pain Score 09/06/17 0956 10     Pain Loc --      Pain Edu? --      Excl. in GC? --    No data found.  Updated Vital Signs BP (!) 147/97   Pulse 82   Temp 98 F (36.7 C) (Oral)   Resp 20   Wt (!) 305 lb (138.3 kg)   LMP 09/01/2017   SpO2 100%   BMI 59.57 kg/m   Visual Acuity Right Eye Distance:   Left Eye Distance:   Bilateral Distance:    Right Eye Near:   Left Eye Near:    Bilateral  Near:     Physical Exam  Constitutional: She appears well-developed.  Pt ill appearing. Non toxic. No distress.   HENT:  Head: Normocephalic and atraumatic.  Cardiovascular: Normal rate and regular rhythm.  Pulmonary/Chest: Effort normal and breath sounds normal.  Abdominal: Soft. Normal aorta. Bowel sounds are decreased. There is generalized tenderness and tenderness in the periumbilical area. There is no CVA tenderness.  Obese.  Tender to deep palpation of periumbilical and mid upper abdomen.  Pain is just below the epigastric area. No lower abdominal pain. Negative rebound.  Neurological: She is alert.  Skin: Skin is warm and dry.  Psychiatric: She has a normal mood and affect.  Nursing note and vitals reviewed.    UC Treatments / Results  Labs (all labs ordered are listed, but only abnormal results are displayed) Labs Reviewed  POCT URINALYSIS DIP (DEVICE) - Abnormal; Notable for the following components:      Result Value   Hgb urine dipstick TRACE (*)    Leukocytes, UA TRACE (*)    All other components within normal limits  POCT PREGNANCY, URINE    EKG None  Radiology Dg Abd 2 Views  Result Date: 09/06/2017 CLINICAL DATA:  Generalized abdominal pain EXAM: ABDOMEN - 2 VIEW COMPARISON:  None. FINDINGS: The bowel gas pattern is normal. There is no evidence of free air. No radio-opaque calculi or other significant radiographic abnormality is seen. IMPRESSION: Negative. Electronically Signed   By: Charlett NoseKevin  Dover M.D.   On: 09/06/2017 11:22    Procedures Procedures (including critical care time)  Medications Ordered in UC Medications  ondansetron (ZOFRAN-ODT) disintegrating tablet 4 mg (4 mg Oral Given 09/06/17 1052)    Initial Impression / Assessment and Plan / UC Course  I have reviewed the triage vital signs and the nursing notes.  Pertinent labs & imaging results that were available during my care of the patient were reviewed by me and considered in my medical  decision making (see chart for details).     Will get x ray to r/o bowel obstruction. Based on pt symptoms. No hx of GERD, this could still be a possibility.  Zofran given for nausea.   Xray  negative for bowel obstruction or constipation.  Will send urine for culture  Pt has a hx of similar symptoms in the past and was told to follow up with GI for ultrasound and she never did. She has been having intermittent episodes of similar symptoms like today but worse today. She is obese so hard to examine the abdomen. Pain is just above umbilicus. Non tender in the epigastric area.  Diff dx GERD, ulcer, gastroenteritis.   Will order ultrasound and see if pt can have exam done today. Rates pain 8/10.  Zofran helped with the nausea and vomiting.   Pt has an ultrasound scheduled for 1 pm.    Ultrasound negative Will continue with plan to treat for GERD.  Pt contacted and aware of results and agreeable to plan.  Follow up as needed for continued or worsening symptoms  Final Clinical Impressions(s) / UC Diagnoses   Final diagnoses:  Pain of upper abdomen   Discharge Instructions   None    ED Prescriptions    None     Controlled Substance Prescriptions [place] Controlled Substance Registry consulted? Not Applicable   Janace Aris, NP 09/06/17 1215    Janace Aris, NP 09/06/17 1645

## 2017-09-06 NOTE — ED Triage Notes (Signed)
Abdominal pain started this morning. Cramps , nausea

## 2017-09-06 NOTE — Discharge Instructions (Signed)
Please go over to the hospital to have an ultrasound done for your abdomen.

## 2018-12-29 ENCOUNTER — Emergency Department (HOSPITAL_COMMUNITY): Payer: BC Managed Care – PPO

## 2018-12-29 ENCOUNTER — Encounter (HOSPITAL_COMMUNITY): Payer: Self-pay

## 2018-12-29 ENCOUNTER — Other Ambulatory Visit: Payer: Self-pay

## 2018-12-29 ENCOUNTER — Emergency Department (HOSPITAL_COMMUNITY)
Admission: EM | Admit: 2018-12-29 | Discharge: 2018-12-29 | Disposition: A | Discharge status: Home / Self Care | Payer: BC Managed Care – PPO | Attending: Emergency Medicine | Admitting: Emergency Medicine

## 2018-12-29 DIAGNOSIS — M546 Pain in thoracic spine: Secondary | ICD-10-CM

## 2018-12-29 DIAGNOSIS — I10 Essential (primary) hypertension: Secondary | ICD-10-CM | POA: Insufficient documentation

## 2018-12-29 MED ORDER — IBUPROFEN 600 MG PO TABS: 600.0000 mg | ORAL_TABLET | Freq: Four times a day (QID) | ORAL | 0 refills | Status: DC | PRN

## 2018-12-29 MED ORDER — DIAZEPAM 5 MG PO TABS
5.0000 mg | ORAL_TABLET | Freq: Once | ORAL | Status: AC
  Administered 2018-12-29: 04:00:00 5 mg via ORAL
  Filled 2018-12-29: qty 1

## 2018-12-29 MED ORDER — OXYCODONE-ACETAMINOPHEN 5-325 MG PO TABS: 1.0000 | ORAL_TABLET | Freq: Four times a day (QID) | ORAL | 0 refills | Status: DC | PRN

## 2018-12-29 MED ORDER — OXYCODONE-ACETAMINOPHEN 5-325 MG PO TABS
1.0000 | ORAL_TABLET | Freq: Once | ORAL | Status: AC
  Administered 2018-12-29: 1 via ORAL
  Filled 2018-12-29: qty 1

## 2018-12-29 MED ORDER — DIAZEPAM 5 MG PO TABS: 5.0000 mg | ORAL_TABLET | Freq: Two times a day (BID) | ORAL | 0 refills | Status: DC

## 2018-12-29 MED ORDER — IBUPROFEN 200 MG PO TABS
600.0000 mg | ORAL_TABLET | Freq: Once | ORAL | Status: AC
  Administered 2018-12-29: 04:00:00 600 mg via ORAL
  Filled 2018-12-29: qty 3

## 2018-12-29 NOTE — ED Triage Notes (Signed)
Pt BIB [facility] from [facility] where she works at the [employer]. 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 She has been treating it with OTC meds, but tonight it flared up. Reports difficulty ambulating. Pain from lower neck down to buttocks.

## 2018-12-29 NOTE — ED Provider Notes (Signed)
[facility]-EMERGENCY DEPT Provider Note   CSN: [id number] Arrival date & time: 12/29/18  0019     History Chief Complaint  Patient presents with  . Back Pain    Priscilla Powell is a 38 y.o. female.  Patient to ED with complaint of thoracic back pain. She has a history of chronic pain in this area, however, 2 days ago was lifting a patient during her work shift and caused an exacerbation. Tonight, while at work, she experienced an increase in the severity of her pain causing emergency department evaluation. No SOB, chest pain, radiation of the pain away from the mid-thoracic back bilaterally. 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 She states this is like her chronic pain but worse.   No language interpreter was used.  Back Pain Associated symptoms: no chest pain, no fever, no headaches, no numbness and no weakness        Past Medical History:  Diagnosis Date  . Bilateral external ear infections   . BV (bacterial vaginosis)   . Cervical incompetence affecting management of pregnancy, antepartum 11/10/2012  . Headache(784.0)   . Preterm uterine contractions, antepartum 11/10/2012  . Trichimoniasis   . UTI (lower urinary tract infection)   . Vaginal bleeding in pregnancy 11/10/2012  . Vertigo     Patient Active Problem List   Diagnosis Date Noted  . Preterm delivery 11/14/2012  . NSVD (normal spontaneous vaginal delivery) 11/14/2012  . DYSPNEA 09/13/2006  . SYMPTOM, PAIN, ABDOMINAL, EPIGASTRIC 09/13/2006  . HYPERTENSION, BENIGN ESSENTIAL 02/18/2006    Past Surgical History:  Procedure Laterality Date  . CERVICAL CERCLAGE N/A 08/24/2012   Procedure: CERCLAGE CERVICAL;  Surgeon: Oliver PilaKathy W Richardson, MD;  Location: [facility];  Service: Gynecology;  Laterality: N/A;  . CRYOABLATION       OB History    Gravida  1   Para  1   Term      Preterm  1   AB        Living  1     SAB      TAB      Ectopic      Multiple      Live Births  1           Family History  Problem Relation Age of Onset  . Diabetes Mother     Social History   Tobacco Use  . Smoking status: Never Smoker  . Smokeless tobacco: Never Used  Substance Use Topics  . Alcohol use: No  . Drug use: No    Home Medications Prior to Admission medications   Medication Sig Start Date End Date Taking? Authorizing Provider  acetaminophen (TYLENOL) 325 MG tablet Take 650 mg by mouth every 6 (six) hours as needed for fever.    [provider]  famotidine (PEPCID) 20 MG tablet Take 1 tablet (20 mg total) by mouth 2 (two) times daily. Patient not taking: Reported on 09/06/2017 01/19/17   Renne CriglerGeiple, Joshua, PA-C  penicillin v potassium (VEETID) 500 MG tablet Take 1 tablet (500 mg total) by mouth 3 (three) times daily. Patient not taking: Reported on 09/06/2017 01/19/17   Renne CriglerGeiple, Joshua, PA-C    Allergies    Patient has no known allergies.  Review of Systems   Review of Systems  Constitutional: Negative for chills and fever.  HENT: Negative.  Negative for congestion and sore throat.  Respiratory: Negative.  Negative for cough and shortness of breath.   Cardiovascular: Negative.  Negative for chest pain.  Gastrointestinal: Negative.  Negative for diarrhea, nausea and vomiting.  Musculoskeletal: Positive for back pain.  Skin: Negative.   Neurological: Negative.  Negative for weakness, numbness and headaches.    Physical Exam Updated Vital Signs BP (!) 148/101   Pulse 78   Temp (!) 100.4 F (38 C) (Oral)   Resp 18   Ht 5\' 2"  (1.575 m)   Wt 136.1 kg   SpO2 100%   BMI 54.87 kg/m   Physical Exam Vitals and nursing note reviewed.  Constitutional:      Appearance: She is well-developed.  HENT:     Head: Normocephalic.  Cardiovascular:     Rate and Rhythm: Normal rate and regular rhythm.     Heart sounds: No murmur.  Pulmonary:     Effort: Pulmonary  effort is normal.     Breath sounds: Normal breath sounds. No wheezing, rhonchi or rales.  Chest:     Chest wall: No tenderness.  Abdominal:     General: Bowel sounds are normal.     Palpations: Abdomen is soft.     Tenderness: There is no abdominal tenderness. There is no guarding or rebound.  Musculoskeletal:        General: Normal range of motion.     Cervical back: Normal range of motion and neck supple.       Back:  Skin:    General: Skin is warm and dry.  Neurological:     Mental Status: She is alert and oriented to person, place, and time.     Sensory: No sensory deficit.     ED Results / Procedures / Treatments   Labs (all labs ordered are listed, but only abnormal results are displayed) Labs Reviewed - No data to display  EKG None  Radiology No results found.  Procedures Procedures (including critical care time)  Medications Ordered in ED Medications  oxyCODONE-acetaminophen (PERCOCET/ROXICET) 5-325 MG per tablet 1 tablet (has no administration in time range)  ibuprofen (ADVIL) tablet 600 mg (has no administration in time range)  diazepam (VALIUM) tablet 5 mg (has no administration in time range)    ED Course  I have reviewed the triage vital signs and the nursing notes.  Pertinent labs & imaging results that were available during my care of the patient were reviewed by me and considered in my medical decision making (see chart for details).    MDM Rules/Calculators/A&P     CHA2DS2/VAS Stroke Risk Points      N/A >= 2 Points: High Risk  1 - 1.99 Points: Medium Risk  0 Points: Low Risk    A final score could not be computed because of missing components.: Last  Change: N/A     This score determines the patient's risk of having a stroke if the  patient has atrial fibrillation.      This score is not applicable to this patient. Components are not  calculated.                   Patient to ED with acute on chronic thoracic back pain, exacerbated by  heavy lifting 2 days ago.    No ss/sxs of infection, including COVID. CXR clear. No hypoxia.   No neurologic findings on history or exam. Pain is improved with medications here. She is felt appropriate for discharge home.  Final Clinical Impression(s) / ED Diagnoses Final  diagnoses:  None   1. Acute on chronic thoracic back pain.  Rx / DC Orders ED Discharge Orders    None       Elpidio Anis, PA-C 12/29/18 5170    Paula Libra, MD 12/29/18 0500

## 2018-12-29 NOTE — Discharge Instructions (Signed)
Rest the back as much as possible. Apply warm compresses as directed in discharge instructions.   Take medications as prescribed. Follow up with your doctor for recheck if pain continues.

## 2019-09-11 ENCOUNTER — Emergency Department (HOSPITAL_COMMUNITY)
Admission: EM | Admit: 2019-09-11 | Discharge: 2019-09-12 | Disposition: A | Discharge status: Home / Self Care | Payer: BC Managed Care – PPO | Attending: Emergency Medicine | Admitting: Emergency Medicine

## 2019-09-11 ENCOUNTER — Other Ambulatory Visit: Payer: Self-pay

## 2019-09-11 DIAGNOSIS — S39012A Strain of muscle, fascia and tendon of lower back, initial encounter: Secondary | ICD-10-CM

## 2019-09-11 DIAGNOSIS — Y990 Civilian activity done for income or pay: Secondary | ICD-10-CM | POA: Insufficient documentation

## 2019-09-11 DIAGNOSIS — G43501 Persistent migraine aura without cerebral infarction, not intractable, with status migrainosus: Secondary | ICD-10-CM

## 2019-09-11 DIAGNOSIS — S3992XA Unspecified injury of lower back, initial encounter: Secondary | ICD-10-CM | POA: Insufficient documentation

## 2019-09-11 DIAGNOSIS — Y929 Unspecified place or not applicable: Secondary | ICD-10-CM | POA: Insufficient documentation

## 2019-09-11 DIAGNOSIS — W07XXXA Fall from chair, initial encounter: Secondary | ICD-10-CM | POA: Insufficient documentation

## 2019-09-11 DIAGNOSIS — G43509 Persistent migraine aura without cerebral infarction, not intractable, without status migrainosus: Secondary | ICD-10-CM | POA: Insufficient documentation

## 2019-09-11 DIAGNOSIS — R55 Syncope and collapse: Secondary | ICD-10-CM | POA: Insufficient documentation

## 2019-09-11 DIAGNOSIS — Y9389 Activity, other specified: Secondary | ICD-10-CM | POA: Insufficient documentation

## 2019-09-11 MED ORDER — PROCHLORPERAZINE EDISYLATE 10 MG/2ML IJ SOLN
10.0000 mg | Freq: Once | INTRAMUSCULAR | Status: AC
  Administered 2019-09-12: 10 mg via INTRAVENOUS
  Filled 2019-09-11: qty 2

## 2019-09-11 MED ORDER — DEXAMETHASONE SODIUM PHOSPHATE 10 MG/ML IJ SOLN
10.0000 mg | Freq: Once | INTRAMUSCULAR | Status: AC
  Administered 2019-09-12: 10 mg via INTRAVENOUS
  Filled 2019-09-11: qty 1

## 2019-09-11 MED ORDER — SODIUM CHLORIDE 0.9 % IV BOLUS
1000.0000 mL | Freq: Once | INTRAVENOUS | Status: AC
  Administered 2019-09-12: 1000 mL via INTRAVENOUS

## 2019-09-11 MED ORDER — KETOROLAC TROMETHAMINE 15 MG/ML IJ SOLN
15.0000 mg | Freq: Once | INTRAMUSCULAR | Status: AC
  Administered 2019-09-12: 15 mg via INTRAVENOUS
  Filled 2019-09-11: qty 1

## 2019-09-11 MED ORDER — DIPHENHYDRAMINE HCL 50 MG/ML IJ SOLN
12.5000 mg | Freq: Once | INTRAMUSCULAR | Status: AC
  Administered 2019-09-12: 12.5 mg via INTRAVENOUS
  Filled 2019-09-11: qty 1

## 2019-09-11 NOTE — ED Provider Notes (Signed)
[facility]-EMERGENCY DEPT Provider Note  CSN: [id number] Arrival date & time: 09/11/19 2300  Chief Complaint(s) Near Syncope and Headache  HPI Priscilla Powell is a 39 y.o. female   CC: migraine headache  Onset/Duration: gradual for 1 week Timing: fluctuating. Usually worse at night Location: frontal, L>R Quality: aching Severity: moderate to severe Modifying Factors:  Improved by: OTC - but returns.   Worsened by: light, sounds.  Associated Signs/Symptoms:  Pertinent (+): N/V, blurry vision in left eye  Pertinent (-): recent fevers, infection, chest pain, SOB, abd, diarrhea, dysuria  Patient had a near syncopal episode at work after a bout reflux that 'caused her to cough vigorously. She ended up falling from a chair onto her knees. Complains of knee pain. Also has had lower back pain due to occupation, which is worse after the fall.   HPI  Past Medical History Past Medical History:  Diagnosis Date  . Bilateral external ear infections   . BV (bacterial vaginosis)   . Cervical incompetence affecting management of pregnancy, antepartum 11/10/2012  . Headache(784.0)   . Preterm uterine contractions, antepartum 11/10/2012  . Trichimoniasis   . UTI (lower urinary tract infection)   . Vaginal bleeding in pregnancy 11/10/2012  . Vertigo    Patient Active Problem List   Diagnosis Date Noted  . Preterm delivery 11/14/2012  . NSVD (normal spontaneous vaginal delivery) 11/14/2012  . DYSPNEA 09/13/2006  . SYMPTOM, PAIN, ABDOMINAL, EPIGASTRIC 09/13/2006  . HYPERTENSION, BENIGN ESSENTIAL 02/18/2006   Home Medication(s) Prior to Admission medications   Medication Sig Start Date End Date Taking? Authorizing Provider  acetaminophen (TYLENOL) 325 MG tablet Take 650 mg by mouth every 6 (six) hours as needed for fever.    [provider]  diazepam (VALIUM) 5 MG tablet Take 1 tablet (5 mg total) by mouth 2 (two) times daily. 12/29/18   Elpidio AnisUpstill, Shari, PA-C   famotidine (PEPCID) 20 MG tablet Take 1 tablet (20 mg total) by mouth 2 (two) times daily. Patient not taking: Reported on 09/06/2017 01/19/17   Renne CriglerGeiple, Joshua, PA-C  ibuprofen (ADVIL) 600 MG tablet Take 1 tablet (600 mg total) by mouth every 6 (six) hours as needed. 12/29/18   Elpidio AnisUpstill, Shari, PA-C  oxyCODONE-acetaminophen (PERCOCET/ROXICET) 5-325 MG tablet Take 1 tablet by mouth every 6 (six) hours as needed for severe pain. 12/29/18   Elpidio AnisUpstill, Shari, PA-C  penicillin v potassium (VEETID) 500 MG tablet Take 1 tablet (500 mg total) by mouth 3 (three) times daily. Patient not taking: Reported on 09/06/2017 01/19/17   Renne CriglerGeiple, Joshua, PA-C                                                                                                                                    Past Surgical History Past Surgical History:  Procedure Laterality Date  . CERVICAL CERCLAGE N/A 08/24/2012   Procedure: CERCLAGE CERVICAL;  Surgeon: Oliver PilaKathy W Richardson, MD;  [facility];  Service: Gynecology;  Laterality: N/A;  . CRYOABLATION     Family History Family History  Problem Relation Age of Onset  . Diabetes Mother     Social History Social History   Tobacco Use  . Smoking status: Never Smoker  . Smokeless tobacco: Never Used  Substance Use Topics  . Alcohol use: No  . Drug use: No   Allergies Patient has no known allergies.  Review of Systems Review of Systems All other systems are reviewed and are negative for acute change except as noted in the HPI  Physical Exam Vital Signs  I have reviewed the triage vital signs BP 113/63 (BP Location: Right Arm)   Pulse 99   Temp 98.1 F (36.7 C) (Oral)   Resp 18   Ht 5' (1.524 m)   Wt (!) 136.1 kg   LMP 08/28/2019 (Approximate)   SpO2 100%   BMI 58.60 kg/m   Physical Exam Vitals reviewed.  Constitutional:      General: She is not in acute distress.    Appearance: She is well-developed. She is obese. She is not diaphoretic.  HENT:     Head:  Normocephalic and atraumatic.     Nose: Nose normal.  Eyes:     General: No scleral icterus.       Right eye: No discharge.        Left eye: No discharge.     Conjunctiva/sclera: Conjunctivae normal.     Pupils: Pupils are equal, round, and reactive to light.  Cardiovascular:     Rate and Rhythm: Normal rate and regular rhythm.     Heart sounds: No murmur heard.  No friction rub. No gallop.   Pulmonary:     Effort: Pulmonary effort is normal. No respiratory distress.     Breath sounds: Normal breath sounds. No stridor. No rales.  Abdominal:     General: There is no distension.     Palpations: Abdomen is soft.     Tenderness: There is no abdominal tenderness.  Musculoskeletal:     Cervical back: Normal range of motion and neck supple.     Lumbar back: Spasms, tenderness and bony tenderness present.       Back:  Skin:    General: Skin is warm and dry.     Findings: No erythema or rash.  Neurological:     Mental Status: She is alert and oriented to person, place, and time.     Comments: Mental Status:  Alert and oriented to person, place, and time.  Attention and concentration normal.  Speech clear.  Recent memory is intact  Cranial Nerves:  II Visual Fields: Intact to confrontation. Visual fields intact. III, IV, VI: Pupils equal and reactive to light and near. Full eye movement without nystagmus  V Facial Sensation: Normal. No weakness of masticatory muscles  VII: No facial weakness or asymmetry  VIII Auditory Acuity: Grossly normal  IX/X: The uvula is midline; the palate elevates symmetrically  XI: Normal sternocleidomastoid and trapezius strength  XII: The tongue is midline. No atrophy or fasciculations.   Motor System: Muscle Strength: 5/5 and symmetric in the upper and lower extremities. No pronation or drift.  Muscle Tone: Tone and muscle bulk are normal in the upper and lower extremities.   Coordination:  No tremor.  Sensation: Intact to light touch..  Gait:  Routine gait normal.      ED Results and Treatments Labs (all labs ordered are listed, but only abnormal results are  displayed) Labs Reviewed  CBC - Abnormal; Notable for the following components:      Result Value   Hemoglobin 10.1 (*)    HCT 32.3 (*)    MCH 25.4 (*)    RDW 17.0 (*)    All other components within normal limits  COMPREHENSIVE METABOLIC PANEL - Abnormal; Notable for the following components:   Calcium 8.8 (*)    All other components within normal limits  I-STAT BETA HCG BLOOD, ED ([facility], [facility], [facility] ONLY)                                                                                                                         EKG  EKG Interpretation  Date/Time:  Wednesday September 12 2019 00:14:20 EDT Ventricular Rate:  89 PR Interval:    QRS Duration: 67 QT Interval:  381 QTC Calculation: 464 R Axis:   68 Text Interpretation: Sinus rhythm No acute changes Confirmed by Drema Pry [phone]) on 09/12/2019 1:02:03 AM      Radiology No results found.  Pertinent labs & imaging results that were available during my care of the patient were reviewed by me and considered in my medical decision making (see chart for details).  Medications Ordered in ED Medications  prochlorperazine (COMPAZINE) injection 10 mg (10 mg Intravenous Given 09/12/19 0036)  diphenhydrAMINE (BENADRYL) injection 12.5 mg (12.5 mg Intravenous Given 09/12/19 0036)  dexamethasone (DECADRON) injection 10 mg (10 mg Intravenous Given 09/12/19 0036)  sodium chloride 0.9 % bolus 1,000 mL (1,000 mLs Intravenous New Bag/Given (Non-Interop) 09/12/19 0013)  ketorolac (TORADOL) 15 MG/ML injection 15 mg (15 mg Intravenous Given 09/12/19 0036)                                                                                                                                    Procedures .1-3 Lead EKG Interpretation Performed by: Nira Conn, MD Authorized by: Nira Conn, MD     Interpretation:  normal     ECG rate:  92   ECG rate assessment: normal     Rhythm: sinus rhythm     Ectopy: none     Conduction: normal      (including critical care time)  Medical Decision Making / ED Course I have reviewed the nursing notes for this encounter and the patient's prior records (if available in EHR or on provided paperwork).   Priscilla Salk  was evaluated in Emergency Department on 09/12/2019 for the symptoms described in the history of present illness. She was evaluated in the context of the global COVID-19 pandemic, which necessitated consideration that the patient might be at risk for infection with the SARS-CoV-2 virus that causes COVID-19. Institutional protocols and algorithms that pertain to the evaluation of patients at risk for COVID-19 are in a state of rapid change based on information released by regulatory bodies including the CDC and federal and state organizations. These policies and algorithms were followed during the patient's care in the ED.  Typical migraine headache for the pt. Non focal neuro exam. No recent head trauma. No fever. Doubt meningitis. Doubt intracranial bleed. Doubt IIH. No indication for imaging.   Will treat with migraine cocktail and reevaluate.  Near syncope related to vigorous coughing.  EKG without acute ischemic changes, dysrhythmias, blocks.  Doubt cardiac etiology.  Lower back pain related to muscle strain/spasm. No midline tenderness. No deficits concerning for serious injury requiring imaging. No knee tenderness concerning for injury.  Near complete resolution of patient's headache and back pain with medicine.  Is able to tolerate oral intake here.  Ambulated to the bathroom without complication.      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 Safe for discharge with strict return precautions.  Disposition: Discharge  Condition: Good  I have discussed the results, Dx and Tx plan with the patient/family who expressed understanding and agree(s) with the plan. Discharge instructions discussed at length. The patient/family was given strict return precautions who verbalized understanding of the instructions. No further questions at time of discharge.    ED Discharge Orders    None        Follow Up: [facility] [address] [facility] [address] [place] [address] [phone]  Schedule an appointment as soon as possible for a visit  As needed     This chart was dictated using voice recognition software.  Despite best efforts to proofread,  errors can occur which can change the documentation meaning.   Nira Conn, MD 09/12/19 [phone]

## 2019-09-11 NOTE — ED Triage Notes (Addendum)
Pt presents from work, reports witnessed syncopal episode, denies hitting head. Pt c/o HA, nausea/vomiting, blurred vision to R eye and dizziness since Saturday but states dizziness got worse today, hx vertigo and states this feels similar, denies other medical hx

## 2019-09-12 LAB — COMPREHENSIVE METABOLIC PANEL
ALT: 15 U/L (ref 0–44)
AST: 19 U/L (ref 15–41)
Albumin: 4 g/dL (ref 3.5–5.0)
Alkaline Phosphatase: 44 U/L (ref 38–126)
Anion gap: 9 (ref 5–15)
BUN: 12 mg/dL (ref 6–20)
CO2: 25 mmol/L (ref 22–32)
Calcium: 8.8 mg/dL — ABNORMAL LOW (ref 8.9–10.3)
Chloride: 104 mmol/L (ref 98–111)
Creatinine, Ser: 0.6 mg/dL (ref 0.44–1.00)
GFR calc Af Amer: >60 mL/min (ref >60)
GFR calc non Af Amer: >60 mL/min (ref >60)
Glucose, Bld: 99 mg/dL (ref 70–99)
Potassium: 3.9 mmol/L (ref 3.5–5.1)
Sodium: 138 mmol/L (ref 135–145)
Total Bilirubin: 0.5 mg/dL (ref 0.3–1.2)
Total Protein: 7.1 g/dL (ref 6.5–8.1)

## 2019-09-12 LAB — CBC
HCT: 32.3 % — ABNORMAL LOW (ref 36.0–46.0)
Hemoglobin: 10.1 g/dL — ABNORMAL LOW (ref 12.0–15.0)
MCH: 25.4 pg — ABNORMAL LOW (ref 26.0–34.0)
MCHC: 31.3 g/dL (ref 30.0–36.0)
MCV: 81.2 fL (ref 80.0–100.0)
Platelets: 247 10*3/uL (ref 150–400)
RBC: 3.98 MIL/uL (ref 3.87–5.11)
RDW: 17 % — ABNORMAL HIGH (ref 11.5–15.5)
WBC: 6.8 10*3/uL (ref 4.0–10.5)
nRBC: 0 % (ref 0.0–0.2)

## 2019-09-12 LAB — I-STAT BETA HCG BLOOD, ED (MC, WL, AP ONLY): I-stat hCG, quantitative: <5 m[IU]/mL (ref <5)

## 2019-09-12 NOTE — Discharge Instructions (Signed)
You may use over-the-counter Motrin (Ibuprofen), Acetaminophen (Tylenol), topical muscle creams such as SalonPas, Icy Hot, Bengay, etc. Please stretch, apply heat, and have massage therapy for additional assistance. ° °

## 2019-09-12 NOTE — ED Notes (Signed)
Pt given water and warm blanket, offered to reposition bed but pt states she is comfortable.

## 2019-09-12 NOTE — ED Notes (Signed)
Pt ambulated to BR without assistance

## 2020-08-07 ENCOUNTER — Emergency Department (HOSPITAL_COMMUNITY)
Admission: EM | Admit: 2020-08-07 | Discharge: 2020-08-07 | Disposition: A | Discharge status: Home / Self Care | Payer: Medicaid Other | Attending: Emergency Medicine | Admitting: Emergency Medicine

## 2020-08-07 ENCOUNTER — Other Ambulatory Visit: Payer: Self-pay

## 2020-08-07 DIAGNOSIS — R42 Dizziness and giddiness: Secondary | ICD-10-CM

## 2020-08-07 DIAGNOSIS — U071 COVID-19: Secondary | ICD-10-CM | POA: Insufficient documentation

## 2020-08-07 LAB — CBC WITH DIFFERENTIAL/PLATELET
Abs Immature Granulocytes: 0 10*3/uL (ref 0.00–0.07)
Basophils Absolute: 0 10*3/uL (ref 0.0–0.1)
Basophils Relative: 1 %
Eosinophils Absolute: 0.1 10*3/uL (ref 0.0–0.5)
Eosinophils Relative: 2 %
HCT: 38.5 % (ref 36.0–46.0)
Hemoglobin: 12.1 g/dL (ref 12.0–15.0)
Immature Granulocytes: 0 %
Lymphocytes Relative: 41 %
Lymphs Abs: 1.3 10*3/uL (ref 0.7–4.0)
MCH: 25.4 pg — ABNORMAL LOW (ref 26.0–34.0)
MCHC: 31.4 g/dL (ref 30.0–36.0)
MCV: 80.7 fL (ref 80.0–100.0)
Monocytes Absolute: 0.3 10*3/uL (ref 0.1–1.0)
Monocytes Relative: 8 %
Neutro Abs: 1.5 10*3/uL — ABNORMAL LOW (ref 1.7–7.7)
Neutrophils Relative %: 48 %
Platelets: 227 10*3/uL (ref 150–400)
RBC: 4.77 MIL/uL (ref 3.87–5.11)
RDW: 17.1 % — ABNORMAL HIGH (ref 11.5–15.5)
WBC: 3.1 10*3/uL — ABNORMAL LOW (ref 4.0–10.5)
nRBC: 0 % (ref 0.0–0.2)

## 2020-08-07 LAB — BASIC METABOLIC PANEL
Anion gap: 7 (ref 5–15)
BUN: 10 mg/dL (ref 6–20)
CO2: 28 mmol/L (ref 22–32)
Calcium: 9.1 mg/dL (ref 8.9–10.3)
Chloride: 103 mmol/L (ref 98–111)
Creatinine, Ser: 0.61 mg/dL (ref 0.44–1.00)
GFR, Estimated: >60 mL/min (ref >60)
Glucose, Bld: 100 mg/dL — ABNORMAL HIGH (ref 70–99)
Potassium: 4 mmol/L (ref 3.5–5.1)
Sodium: 138 mmol/L (ref 135–145)

## 2020-08-07 LAB — RESP PANEL BY RT-PCR (FLU A&B, COVID) ARPGX2
Influenza A by PCR: NEGATIVE
Influenza B by PCR: NEGATIVE
SARS Coronavirus 2 by RT PCR: POSITIVE — AB

## 2020-08-07 NOTE — ED Notes (Signed)
Pt reports still feeling dizzy and lightheaded.

## 2020-08-07 NOTE — ED Notes (Signed)
Pt requested for IV to be removed due to it hurting her arm. IV was removed and RN Susy Frizzle made aware

## 2020-08-07 NOTE — Discharge Instructions (Addendum)
Call your primary care doctor or specialist as discussed in the next 2-3 days.   Return immediately back to the ER if:  Your symptoms worsen within the next 12-24 hours. You develop new symptoms such as new fevers, persistent vomiting, new pain, shortness of breath, or new weakness or numbness, or if you have any other concerns.  

## 2020-08-07 NOTE — ED Triage Notes (Addendum)
From work tonight via ems for sudden onset of generalized weakness, dizziness,headache.  Pt reports she was standing up and doing laundry at work when the heat from dryer hit her in the face and she suddenly felt very hot, dizzy and weak.  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  Pt has had a fever on Sunday, but afebrile since and has been tested for covid this week and was negative.  Pt states these symptoms have happened to her before and seen for it, but can't recall the diagnosis.  Pt given IV zofran in route.  Last vitals: 152/94, HR 90, RR 16, 02 sats 98%, cbg 107.  Pt a&ox4, ambulatory, gcs 15. Pt reports cold symptoms all week.

## 2020-08-07 NOTE — ED Notes (Signed)
An After Visit Summary was printed and given to the patient. Discharge instructions given and no further questions at this time.  

## 2020-08-07 NOTE — ED Provider Notes (Signed)
[facility]-EMERGENCY DEPT Provider Note   CSN: [id number] Arrival date & time: 08/07/20  0259     History Chief Complaint  Patient presents with   Dizziness    Priscilla Powell is a 40 y.o. female.  Patient states that she was at work and doing laundry when a wave of heat from the dryer went over her and she felt lightheaded and dizzy.  She sat down and symptoms persisted.  She states that she is also had a cough and intermittent headache for the past 3 to 4 days.  Denies fevers denies vomiting denies diarrhea denies chest pain or abdominal pain.      Past Medical History:  Diagnosis Date   Bilateral external ear infections    BV (bacterial vaginosis)    Cervical incompetence affecting management of pregnancy, antepartum 11/10/2012   Headache(784.0)    Preterm uterine contractions, antepartum 11/10/2012   Trichimoniasis    UTI (lower urinary tract infection)    Vaginal bleeding in pregnancy 11/10/2012   Vertigo     Patient Active Problem List   Diagnosis Date Noted   Preterm delivery 11/14/2012   NSVD (normal spontaneous vaginal delivery) 11/14/2012   DYSPNEA 09/13/2006   SYMPTOM, PAIN, ABDOMINAL, EPIGASTRIC 09/13/2006   HYPERTENSION, BENIGN ESSENTIAL 02/18/2006    Past Surgical History:  Procedure Laterality Date   CERVICAL CERCLAGE N/A 08/24/2012   Procedure: CERCLAGE CERVICAL;  Surgeon: Oliver Pila, MD;  Location: [facility];  Service: Gynecology;  Laterality: N/A;   CRYOABLATION       OB History     Gravida  1   Para  1   Term      Preterm  1   AB      Living  1      SAB      IAB      Ectopic      Multiple      Live Births  1           Family History  Problem Relation Age of Onset   Diabetes Mother     Social History   Tobacco Use   Smoking status: Never   Smokeless tobacco: Never  Substance Use Topics   Alcohol use: No   Drug use: No    Home Medications Prior to Admission medications   Medication  Sig Start Date End Date Taking? Authorizing Provider  acetaminophen (TYLENOL) 325 MG tablet Take 650 mg by mouth every 6 (six) hours as needed for fever.    [provider]  diazepam (VALIUM) 5 MG tablet Take 1 tablet (5 mg total) by mouth 2 (two) times daily. 12/29/18   Elpidio Anis, PA-C  famotidine (PEPCID) 20 MG tablet Take 1 tablet (20 mg total) by mouth 2 (two) times daily. Patient not taking: Reported on 09/06/2017 01/19/17   Renne Crigler, PA-C  ibuprofen (ADVIL) 600 MG tablet Take 1 tablet (600 mg total) by mouth every 6 (six) hours as needed. 12/29/18   Elpidio Anis, PA-C  oxyCODONE-acetaminophen (PERCOCET/ROXICET) 5-325 MG tablet Take 1 tablet by mouth every 6 (six) hours as needed for severe pain. 12/29/18   Elpidio Anis, PA-C  penicillin v potassium (VEETID) 500 MG tablet Take 1 tablet (500 mg total) by mouth 3 (three) times daily. Patient not taking: Reported on 09/06/2017 01/19/17   Renne Crigler, PA-C    Allergies    Patient has no known allergies.  Review of Systems   Review of Systems  Constitutional:  Negative  for fever.  HENT:  Negative for ear pain.   Eyes:  Negative for pain.  Respiratory:  Positive for cough.   Cardiovascular:  Negative for chest pain.  Gastrointestinal:  Negative for abdominal pain.  Genitourinary:  Negative for flank pain.  Musculoskeletal:  Negative for back pain.  Skin:  Negative for rash.  Neurological:  Negative for headaches.   Physical Exam Updated Vital Signs BP (!) 141/86   Pulse 74   Temp 98.2 F (36.8 C) (Oral)   Resp 15   Ht 5' (1.524 m)   Wt (!) 145.2 kg   LMP 08/02/2020 (Exact Date)   SpO2 99%   BMI 62.50 kg/m   Physical Exam Constitutional:      General: She is not in acute distress.    Appearance: Normal appearance.  HENT:     Head: Normocephalic.     Nose: Nose normal.  Eyes:     Extraocular Movements: Extraocular movements intact.  Cardiovascular:     Rate and Rhythm: Normal rate.  Pulmonary:      Effort: Pulmonary effort is normal.  Musculoskeletal:        General: Normal range of motion.     Cervical back: Normal range of motion.  Neurological:     General: No focal deficit present.     Mental Status: She is alert. Mental status is at baseline.     Cranial Nerves: No cranial nerve deficit.     Motor: No weakness.     Gait: Gait normal.    ED Results / Procedures / Treatments   Labs (all labs ordered are listed, but only abnormal results are displayed) Labs Reviewed  CBC WITH DIFFERENTIAL/PLATELET - Abnormal; Notable for the following components:      Result Value   WBC 3.1 (*)    MCH 25.4 (*)    RDW 17.1 (*)    Neutro Abs 1.5 (*)    All other components within normal limits  BASIC METABOLIC PANEL - Abnormal; Notable for the following components:   Glucose, Bld 100 (*)    All other components within normal limits  RESP PANEL BY RT-PCR (FLU A&B, COVID) [id number]  POC URINE PREG, ED    EKG EKG Interpretation  Date/Time:  Thursday August 07 2020 11:38:02 EDT Ventricular Rate:  67 PR Interval:  141 QRS Duration: 84 QT Interval:  433 QTC Calculation: 458 R Axis:   49 Text Interpretation: Sinus rhythm 12 Lead; Mason-Likar Confirmed by Norman Clay ([id number]) on 08/07/2020 12:35:57 PM  Radiology No results found.  Procedures Procedures   Medications Ordered in ED Medications - No data to display  ED Course  I have reviewed the triage vital signs and the nursing notes.  Pertinent labs & imaging results that were available during my care of the patient were reviewed by me and considered in my medical decision making (see chart for details).    MDM Rules/Calculators/A&P                           Vital signs are within normal limits.  EKG shows sinus rhythm normal rate no ST elevations depressions noted.  Labs otherwise unremarkable as well.  Patient has no abdominal pain or tenderness on exam.  COVID test has been sent and pending final result.  Final Clinical  Impression(s) / ED Diagnoses Final diagnoses:  Dizziness    Rx / DC Orders ED Discharge Orders     None  Cheryll Cockayne, MD 08/07/20 1329

## 2020-08-18 ENCOUNTER — Other Ambulatory Visit: Payer: Self-pay

## 2020-08-18 ENCOUNTER — Inpatient Hospital Stay (HOSPITAL_COMMUNITY)
Admission: RE | Admit: 2020-08-18 | Discharge: 2020-08-22 | DRG: 885 | Disposition: A | Discharge status: Home / Self Care | Payer: Federal, State, Local not specified - Other | Attending: Psychiatry | Admitting: Psychiatry

## 2020-08-18 ENCOUNTER — Encounter (HOSPITAL_COMMUNITY): Payer: Self-pay | Admitting: Family Medicine

## 2020-08-18 DIAGNOSIS — R45851 Suicidal ideations: Secondary | ICD-10-CM | POA: Diagnosis present

## 2020-08-18 DIAGNOSIS — Z20822 Contact with and (suspected) exposure to covid-19: Secondary | ICD-10-CM | POA: Diagnosis present

## 2020-08-18 DIAGNOSIS — Z634 Disappearance and death of family member: Secondary | ICD-10-CM

## 2020-08-18 DIAGNOSIS — F411 Generalized anxiety disorder: Secondary | ICD-10-CM | POA: Diagnosis present

## 2020-08-18 DIAGNOSIS — Z79899 Other long term (current) drug therapy: Secondary | ICD-10-CM | POA: Diagnosis not present

## 2020-08-18 DIAGNOSIS — Z62810 Personal history of physical and sexual abuse in childhood: Secondary | ICD-10-CM | POA: Diagnosis present

## 2020-08-18 DIAGNOSIS — F410 Panic disorder [episodic paroxysmal anxiety] without agoraphobia: Secondary | ICD-10-CM | POA: Diagnosis present

## 2020-08-18 DIAGNOSIS — F4310 Post-traumatic stress disorder, unspecified: Secondary | ICD-10-CM | POA: Diagnosis present

## 2020-08-18 DIAGNOSIS — R32 Unspecified urinary incontinence: Secondary | ICD-10-CM | POA: Diagnosis present

## 2020-08-18 DIAGNOSIS — E669 Obesity, unspecified: Secondary | ICD-10-CM | POA: Diagnosis present

## 2020-08-18 DIAGNOSIS — Z6841 Body Mass Index (BMI) 40.0 and over, adult: Secondary | ICD-10-CM

## 2020-08-18 DIAGNOSIS — F321 Major depressive disorder, single episode, moderate: Principal | ICD-10-CM | POA: Diagnosis present

## 2020-08-18 DIAGNOSIS — K219 Gastro-esophageal reflux disease without esophagitis: Secondary | ICD-10-CM | POA: Diagnosis present

## 2020-08-18 DIAGNOSIS — F329 Major depressive disorder, single episode, unspecified: Secondary | ICD-10-CM | POA: Insufficient documentation

## 2020-08-18 LAB — RESP PANEL BY RT-PCR (FLU A&B, COVID) ARPGX2
Influenza A by PCR: NEGATIVE
Influenza B by PCR: NEGATIVE
SARS Coronavirus 2 by RT PCR: NEGATIVE

## 2020-08-18 LAB — CBC
HCT: 35.4 % — ABNORMAL LOW (ref 36.0–46.0)
Hemoglobin: 10.9 g/dL — ABNORMAL LOW (ref 12.0–15.0)
MCH: 25.1 pg — ABNORMAL LOW (ref 26.0–34.0)
MCHC: 30.8 g/dL (ref 30.0–36.0)
MCV: 81.6 fL (ref 80.0–100.0)
Platelets: 269 10*3/uL (ref 150–400)
RBC: 4.34 MIL/uL (ref 3.87–5.11)
RDW: 17.1 % — ABNORMAL HIGH (ref 11.5–15.5)
WBC: 6.7 10*3/uL (ref 4.0–10.5)
nRBC: 0 % (ref 0.0–0.2)

## 2020-08-18 LAB — COMPREHENSIVE METABOLIC PANEL
ALT: 13 U/L (ref 0–44)
AST: 16 U/L (ref 15–41)
Albumin: 3.9 g/dL (ref 3.5–5.0)
Alkaline Phosphatase: 44 U/L (ref 38–126)
Anion gap: 6 (ref 5–15)
BUN: 10 mg/dL (ref 6–20)
CO2: 27 mmol/L (ref 22–32)
Calcium: 9.4 mg/dL (ref 8.9–10.3)
Chloride: 104 mmol/L (ref 98–111)
Creatinine, Ser: 0.74 mg/dL (ref 0.44–1.00)
GFR, Estimated: >60 mL/min (ref >60)
Glucose, Bld: 97 mg/dL (ref 70–99)
Potassium: 4.1 mmol/L (ref 3.5–5.1)
Sodium: 137 mmol/L (ref 135–145)
Total Bilirubin: 0.5 mg/dL (ref 0.3–1.2)
Total Protein: 7.4 g/dL (ref 6.5–8.1)

## 2020-08-18 LAB — HEMOGLOBIN A1C
Hgb A1c MFr Bld: 5.8 % — ABNORMAL HIGH (ref 4.8–5.6)
Mean Plasma Glucose: 119.76 mg/dL

## 2020-08-18 LAB — LIPID PANEL
Cholesterol: 173 mg/dL (ref 0–200)
HDL: 35 mg/dL — ABNORMAL LOW (ref >40)
LDL Cholesterol: 97 mg/dL (ref 0–99)
Total CHOL/HDL Ratio: 4.9 RATIO
Triglycerides: 205 mg/dL — ABNORMAL HIGH (ref <150)
VLDL: 41 mg/dL — ABNORMAL HIGH (ref 0–40)

## 2020-08-18 LAB — TSH: TSH: 1.219 u[IU]/mL (ref 0.350–4.500)

## 2020-08-18 MED ORDER — FAMOTIDINE 20 MG PO TABS
40.0000 mg | ORAL_TABLET | Freq: Every day | ORAL | Status: DC
  Administered 2020-08-18 – 2020-08-22 (×5): 40 mg via ORAL
  Filled 2020-08-18 (×2): qty 1
  Filled 2020-08-18: qty 14
  Filled 2020-08-18: qty 2
  Filled 2020-08-18: qty 1
  Filled 2020-08-18: qty 2
  Filled 2020-08-18 (×3): qty 1

## 2020-08-18 MED ORDER — TRAZODONE HCL 50 MG PO TABS
50.0000 mg | ORAL_TABLET | Freq: Every evening | ORAL | Status: DC | PRN
  Administered 2020-08-19 – 2020-08-21 (×3): 50 mg via ORAL
  Filled 2020-08-18 (×3): qty 1

## 2020-08-18 MED ORDER — SERTRALINE HCL 25 MG PO TABS
25.0000 mg | ORAL_TABLET | Freq: Every day | ORAL | Status: DC
  Administered 2020-08-18 – 2020-08-21 (×4): 25 mg via ORAL
  Filled 2020-08-18 (×6): qty 1

## 2020-08-18 MED ORDER — ALUM & MAG HYDROXIDE-SIMETH 200-200-20 MG/5ML PO SUSP: 30.0000 mL | ORAL | Status: DC | PRN

## 2020-08-18 MED ORDER — ACETAMINOPHEN 325 MG PO TABS
650.0000 mg | ORAL_TABLET | Freq: Four times a day (QID) | ORAL | Status: DC | PRN
  Administered 2020-08-19 – 2020-08-20 (×2): 650 mg via ORAL
  Filled 2020-08-18 (×2): qty 2

## 2020-08-18 MED ORDER — MAGNESIUM HYDROXIDE 400 MG/5ML PO SUSP: 30.0000 mL | Freq: Every day | ORAL | Status: DC | PRN

## 2020-08-18 MED ORDER — HYDROXYZINE HCL 25 MG PO TABS
25.0000 mg | ORAL_TABLET | Freq: Three times a day (TID) | ORAL | Status: DC | PRN
  Administered 2020-08-19 – 2020-08-21 (×5): 25 mg via ORAL
  Filled 2020-08-18 (×3): qty 1
  Filled 2020-08-18: qty 10
  Filled 2020-08-18 (×2): qty 1

## 2020-08-18 NOTE — H&P (Signed)
Behavioral Health Medical Screening Exam  Priscilla Powell is a 40 y.o. female seen by this provider with TTS counselor face to face, presents to [facility] as voluntary walk-in accompanied by no one.  Patient reports that she has had serious depression, raising her son all along, her husband passed away. She is tearful during the interview.  Reports that her mom has been getting on her back, there is many stressors at home.  Feels like people are against her, feels like people are constantly talking about her.  She feels that her behavior is reflecting her son's behavior.  She endorses suicidal ideation, with intent and plan to get her son and run off the road.  Her last thoughts of doing this were at 6:00 this morning and yesterday morning.  She denies homicidal ideation denies auditory and visual hallucinations.  Denies substance and alcohol use.  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  Reports that she eats a lot, she has gained 10 pounds in the last few days.  Denies any previous suicide attempts.  Total Time spent with patient: 30 minutes  Psychiatric Specialty Exam:  Presentation  General Appearance:  Appropriate for Environment Eye Contact: Fair Speech: Clear and Coherent Speech Volume: Normal Handedness: No data recorded  Mood and Affect  Mood: Depressed; Hopeless Affect: Congruent  Thought Process  Thought Processes: Coherent; Goal Directed Descriptions of Associations:Intact Orientation:Full (Time, Place and Person) Thought Content:Abstract Reasoning; Logical History of Schizophrenia/Schizoaffective disorder:No data recorded Duration of Psychotic Symptoms:No data recorded Hallucinations:Hallucinations: None Ideas of Reference:Paranoia Suicidal Thoughts:Suicidal Thoughts: Yes, Active SI Active Intent and/or Plan: With Intent; With Plan Homicidal Thoughts:Homicidal Thoughts: No  Sensorium   Memory: Immediate Good; Recent Good; Remote Good Judgment: Fair Insight: Fair  Art therapist  Concentration: Good Attention Span: Good Recall: Good Fund of Knowledge: Good Language: Good  Psychomotor Activity  Psychomotor Activity: Psychomotor Activity: Normal  Assets  Assets: Communication Skills; Desire for Improvement; Housing; Social Support  Sleep  Sleep: Sleep: Poor   Physical Exam: Physical Exam Vitals reviewed.  Cardiovascular:     Rate and Rhythm: Normal rate.  Pulmonary:     Effort: Pulmonary effort is normal.     Breath sounds: Normal breath sounds.  Skin:    General: Skin is warm and dry.  Neurological:     Mental Status: She is alert and oriented to person, place, and time.  Psychiatric:        Attention and Perception: Attention normal.        Mood and Affect: Mood is depressed. Affect is flat and tearful.        Speech: Speech normal.        Behavior: Behavior is cooperative.        Thought Content: Thought content is paranoid. Thought content is not delusional. Thought content includes suicidal ideation. Thought content does not include homicidal ideation. Thought content includes suicidal plan. Thought content does not include homicidal plan.   Review of Systems  Constitutional:  Negative for chills and fever.  Respiratory:  Negative for cough and shortness of breath.   Cardiovascular:  Negative for chest pain.  Gastrointestinal:  Negative for abdominal pain, nausea and vomiting.  Neurological:  Negative for headaches.  Psychiatric/Behavioral:  Positive for depression and suicidal ideas. Negative for hallucinations and substance abuse. The patient has insomnia (works night shift and cares for son). The patient is not nervous/anxious.  Blood pressure 133/80, pulse 85, temperature 98.5 F (36.9 C), temperature source Oral, resp. rate 18, height 5\' 1"  (1.549 m), weight (!) 145 kg, last menstrual period 08/02/2020, SpO2 100 %, unknown  if currently breastfeeding. Body mass index is 60.4 kg/m.  Musculoskeletal: Strength & Muscle Tone: within normal limits Gait & Station: normal Patient leans: N/A   Recommendations:  Based on my evaluation the patient does not appear to have an emergency medical condition. Meets criteria for inpatient psychiatric admission. Covid test ordered. Patient agrees with this plan.   08/04/2020, NP 08/18/2020, 4:55 PM

## 2020-08-18 NOTE — Progress Notes (Signed)
Pt provided urine specimen cup with instructions

## 2020-08-18 NOTE — Tx Team (Signed)
Initial Treatment Plan 08/18/2020 4:02 PM Priscilla Powell [id number]    PATIENT STRESSORS: Financial difficulties Occupational concerns Traumatic event   PATIENT STRENGTHS: Ability for insight Capable of independent living Communication skills Motivation for treatment/growth Supportive family/friends Work skills   PATIENT IDENTIFIED PROBLEMS: anxiety  depression  Suicidal ideations  Financial strain               DISCHARGE CRITERIA:  Ability to meet basic life and health needs Improved stabilization in mood, thinking, and/or behavior Motivation to continue treatment in a less acute level of care Need for constant or close observation no longer present  PRELIMINARY DISCHARGE PLAN: Attend aftercare/continuing care group Outpatient therapy Return to previous living arrangement Return to previous work or school arrangements  PATIENT/FAMILY INVOLVEMENT: This treatment plan has been presented to and reviewed with the patient, Priscilla Powell.  The patient and family have been given the opportunity to ask questions and make suggestions.  Raylene Miyamoto, RN 08/18/2020, 4:02 PM
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
## 2020-08-19 DIAGNOSIS — F411 Generalized anxiety disorder: Secondary | ICD-10-CM | POA: Diagnosis present

## 2020-08-19 LAB — URINALYSIS, COMPLETE (UACMP) WITH MICROSCOPIC
Bilirubin Urine: NEGATIVE
Glucose, UA: NEGATIVE mg/dL
Hgb urine dipstick: NEGATIVE
Ketones, ur: NEGATIVE mg/dL
Nitrite: NEGATIVE
Protein, ur: NEGATIVE mg/dL
Specific Gravity, Urine: 1.017 (ref 1.005–1.030)
pH: 6 (ref 5.0–8.0)

## 2020-08-19 LAB — RAPID URINE DRUG SCREEN, HOSP PERFORMED
Amphetamines: NOT DETECTED
Barbiturates: NOT DETECTED
Benzodiazepines: NOT DETECTED
Cocaine: NOT DETECTED
Opiates: NOT DETECTED
Tetrahydrocannabinol: NOT DETECTED

## 2020-08-19 LAB — PREGNANCY, URINE: Preg Test, Ur: NEGATIVE
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
## 2020-08-19 NOTE — Progress Notes (Signed)
   08/19/20 0805  Psych Admission Type (Psych Patients Only)  Admission Status Voluntary  Psychosocial Assessment  Patient Complaints Anxiety;Sadness;Depression  Eye Contact Fair  Facial Expression Anxious;Sullen;Sad;Worried  Affect Anxious;Depressed;Sad;Sullen  Surveyor, minerals Activity Slow  Appearance/Hygiene Unremarkable  Behavior Characteristics Cooperative;Anxious  Mood Depressed;Anxious;Sad  Thought Process  Coherency WDL  Content Blaming others;Blaming self  Delusions None reported or observed  Perception WDL  Hallucination None reported or observed  Judgment Poor  Confusion None  Danger to Self  Current suicidal ideation? Denies  Self-Injurious Behavior No self-injurious ideation or behavior indicators observed or expressed   Agreement Not to Harm Self Yes  Description of Agreement pt verbally agrees to approach staff before harming self while at [facility]  Danger to Others  Danger to Others None reported or observed    Pt said that she was depressed , and she was tearful at various times throughout the day.  RN actively listened to pt and encouraged pt to express her feelings.  RN encouraged pt to work hard by attending groups, engaging with other patients, and to learn new coping skills.  Pt denies SI/HI/AVH. Pt remains safe on the unit with q 15 min room checks in place.
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
## 2020-08-19 NOTE — Progress Notes (Addendum)
Recreation Therapy Notes  Animal-Assisted Activity (AAA) Program Checklist/Progress Notes Patient Eligibility Criteria Checklist & Daily Group note for Rec Tx Intervention  Date: 8.2.22 Time: 1430 Location: [address]  AAA/T Program Assumption of Risk Form signed by Engineer, production or Parent Legal Guardian YES  Patient is free of allergies or severe asthma YES  Patient reports no fear of animals YES  Patient reports no history of cruelty to animals YES   Patient understands his/her participation is voluntary YES  Patient washes hands before animal contact YES  Patient washes hands after animal contact YES  Education: Charity fundraiser, Appropriate Animal Interaction   Education Outcome: Acknowledges understanding/In group clarification offered/Needs additional education.   Clinical Observations/Feedback:  Pt did not attend group activity.     Caroll Rancher, LRT/CTRS        Caroll Rancher A 08/19/2020 3:53 PM

## 2020-08-19 NOTE — Progress Notes (Signed)
Adult Psychoeducational Group Note  Date:  08/19/2020 Time:  7:19 PM  Group Topic/Focus:  Emotional Education:   The focus of this group is to discuss what feelings/emotions are, and how they are experienced.  Participation Level:  Minimal  Participation Quality:  Attentive  Affect:  Appropriate  Cognitive:  Appropriate  Insight: Improving  Engagement in Group:  Improving  Modes of Intervention:  Discussion  Additional Comments:  Pt attended group and listened to group discussion but was not yet ready to share with the other patients.  [name] 08/19/2020, 7:19 PM

## 2020-08-19 NOTE — Progress Notes (Signed)
Pt visible on the unit some this evening, pt stated she was doing ok, pt appeared sad some this evening.     08/19/20 0000  Psych Admission Type (Psych Patients Only)  Admission Status Voluntary  Psychosocial Assessment  Patient Complaints Anxiety;Disorientation  Eye Contact Fair  Facial Expression Anxious;Sullen;Sad;Worried  Affect Anxious;Depressed;Sad;Sullen  Surveyor, minerals Activity Slow  Appearance/Hygiene Unremarkable  Behavior Characteristics Cooperative  Mood Anxious;Depressed  Thought Administrator, sports thinking  Content Blaming others;Blaming self  Delusions None reported or observed  Perception WDL  Hallucination None reported or observed  Judgment Poor  Confusion None  Danger to Self  Current suicidal ideation? Passive  Self-Injurious Behavior Some self-injurious ideation observed or expressed.  No lethal plan expressed   Agreement Not to Harm Self Yes  Description of Agreement pt verbally agrees to approach staff before harming self while at [facility]  Danger to Others  Danger to Others None reported or observed

## 2020-08-19 NOTE — [facility] Counselor (Signed)
CSW provided the Pt with a packet that contained information for: housing resources, childcare resources, an [employer], free/reduced cost food options, [employer], and Suicide Prevention information.

## 2020-08-19 NOTE — Progress Notes (Signed)
Patient states that her positive event for the day is that she spoke to her son and mother. Her goal for tomorrow is to continue "to be stronger".
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
## 2020-08-20 NOTE — Progress Notes (Signed)
Recreation Therapy Notes  Date: 8.3.22 Time: 0930 Location: [address] Dayroom  Group Topic: Stress Management   Goal Area(s) Addresses:  Patient will actively participate in stress management techniques presented during session.  Patient will successfully identify benefit of practicing stress management post d/c.   Behavioral Response: Appropriate  Intervention: Guided exercise with ambient sound and script  Activity :Guided Imagery  LRT read a script that focused on finding peace and relaxation while envisioning being on the beach at sunrise. Patients were given suggestions of ways to access scripts post d/c and encouraged to explore Youtube and other apps available on smartphones, tablets, and computers.   Education:  Stress Management, Discharge Planning.   Education Outcome: Acknowledges education  Clinical Observations/Feedback: Patient actively engaged in technique introduced, expressed no concerns.      Caroll Rancher, LRT/CTRS         Caroll Rancher A 08/20/2020 11:45 AM

## 2020-08-20 NOTE — Progress Notes (Signed)
Pt visible on the unit pt stated she was doing better, Clinical research associate discussed possible solutions of ways to help work through issues she's been dealing with. Pt given PRN Trazodone and Vistaril per [facility]    08/20/20 2300  Psych Admission Type (Psych Patients Only)  Admission Status Voluntary  Psychosocial Assessment  Patient Complaints Depression  Eye Contact Fair  Facial Expression Anxious;Sullen;Sad;Worried  Affect Anxious;Depressed;Sad;Sullen  Surveyor, minerals Activity Slow  Appearance/Hygiene Unremarkable  Behavior Characteristics Cooperative  Mood Depressed  Thought Process  Coherency WDL  Content Blaming others;Blaming self  Delusions None reported or observed  Perception WDL  Hallucination None reported or observed  Judgment Poor  Confusion None  Danger to Self  Current suicidal ideation? Denies  Self-Injurious Behavior No self-injurious ideation or behavior indicators observed or expressed   Agreement Not to Harm Self Yes  Description of Agreement pt verbally agrees to approach staff before harming self while at [facility]  Danger to Others  Danger to Others None reported or observed

## 2020-08-20 NOTE — Progress Notes (Signed)
Psychoeducational Group Note  Date:  08/20/2020 Time:  2127  Group Topic/Focus:  Wrap-Up Group:   The focus of this group is to help patients review their daily goal of treatment and discuss progress on daily workbooks.  Participation Level: Did Not Attend  Participation Quality:  Not Applicable  Affect:  Not Applicable  Cognitive:  Not Applicable  Insight:  Not Applicable  Engagement in Group: Not Applicable  Additional Comments:  The patient did not attend group this evening.   Hazle Coca S 08/20/2020, 9:27 PM

## 2020-08-20 NOTE — Tx Team (Signed)
Interdisciplinary Treatment and Diagnostic Plan Update  08/20/2020 Time of Session: 9:45am  Callyn Severtson MRN: [id number]  Principal Diagnosis: Major depressive disorder, single episode, moderate (HCC)  Secondary Diagnoses: Principal Problem:   Major depressive disorder, single episode, moderate (HCC) Active Problems:   Generalized anxiety disorder   Current Medications:  Current Facility-Administered Medications  Medication Dose Route Frequency Provider Last Rate Last Admin   acetaminophen (TYLENOL) tablet 650 mg  650 mg Oral Q6H PRN Antonieta Pert, MD   650 mg at 08/19/20 0803   alum & mag hydroxide-simeth (MAALOX/MYLANTA) 200-200-20 MG/5ML suspension 30 mL  30 mL Oral Q4H PRN Antonieta Pert, MD       famotidine (PEPCID) tablet 40 mg  40 mg Oral Daily Antonieta Pert, MD   40 mg at 08/20/20 1006   hydrOXYzine (ATARAX/VISTARIL) tablet 25 mg  25 mg Oral TID PRN Antonieta Pert, MD   25 mg at 08/20/20 1008   magnesium hydroxide (MILK OF MAGNESIA) suspension 30 mL  30 mL Oral Daily PRN Antonieta Pert, MD       sertraline (ZOLOFT) tablet 25 mg  25 mg Oral Daily Antonieta Pert, MD   25 mg at 08/20/20 1006   traZODone (DESYREL) tablet 50 mg  50 mg Oral QHS PRN Antonieta Pert, MD   50 mg at 08/19/20 2113   PTA Medications: No medications prior to admission.    Patient Stressors: Financial difficulties Occupational concerns Traumatic event  Patient Strengths: Ability for insight Capable of independent living Communication skills Motivation for treatment/growth Supportive family/friends Work skills  Treatment Modalities: Medication Management, Group therapy, Case management,  1 to 1 session with clinician, Psychoeducation, Recreational therapy.   Physician Treatment Plan for Primary Diagnosis: Major depressive disorder, single episode, moderate (HCC) Long Term Goal(s): Improvement in symptoms so as ready for discharge   Short Term Goals: Ability to  identify changes in lifestyle to reduce recurrence of condition will improve Ability to verbalize feelings will improve Ability to disclose and discuss suicidal ideas Ability to identify and develop effective coping behaviors will improve Ability to identify triggers associated with substance abuse/mental health issues will improve  Medication Management: Evaluate patient's response, side effects, and tolerance of medication regimen.  Therapeutic Interventions: 1 to 1 sessions, Unit Group sessions and Medication administration.  Evaluation of Outcomes: Progressing  Physician Treatment Plan for Secondary Diagnosis: Principal Problem:   Major depressive disorder, single episode, moderate (HCC) Active Problems:   Generalized anxiety disorder  Long Term Goal(s): Improvement in symptoms so as ready for discharge   Short Term Goals: Ability to identify changes in lifestyle to reduce recurrence of condition will improve Ability to verbalize feelings will improve Ability to disclose and discuss suicidal ideas Ability to identify and develop effective coping behaviors will improve Ability to identify triggers associated with substance abuse/mental health issues will improve     Medication Management: Evaluate patient's response, side effects, and tolerance of medication regimen.  Therapeutic Interventions: 1 to 1 sessions, Unit Group sessions and Medication administration.  Evaluation of Outcomes: Progressing   RN Treatment Plan for Primary Diagnosis: Major depressive disorder, single episode, moderate (HCC) Long Term Goal(s): Knowledge of disease and therapeutic regimen to maintain health will improve  Short Term Goals: Ability to remain free from injury will improve, Ability to participate in decision making will improve, Ability to verbalize feelings will improve, Ability to disclose and discuss suicidal ideas, and Ability to identify and develop effective coping behaviors will  improve  Medication Management: RN will administer medications as ordered by provider, will assess and evaluate patient's response and provide education to patient for prescribed medication. RN will report any adverse and/or side effects to prescribing provider.  Therapeutic Interventions: 1 on 1 counseling sessions, Psychoeducation, Medication administration, Evaluate responses to treatment, Monitor vital signs and CBGs as ordered, Perform/monitor CIWA, COWS, AIMS and Fall Risk screenings as ordered, Perform wound care treatments as ordered.  Evaluation of Outcomes: Progressing   LCSW Treatment Plan for Primary Diagnosis: Major depressive disorder, single episode, moderate (HCC) Long Term Goal(s): Safe transition to appropriate next level of care at discharge, Engage patient in therapeutic group addressing interpersonal concerns.  Short Term Goals: Engage patient in aftercare planning with referrals and resources, Increase social support, Increase emotional regulation, Facilitate acceptance of mental health diagnosis and concerns, Identify triggers associated with mental health/substance abuse issues, and Increase skills for wellness and recovery  Therapeutic Interventions: Assess for all discharge needs, 1 to 1 time with Social worker, Explore available resources and support systems, Assess for adequacy in community support network, Educate family and significant other(s) on suicide prevention, Complete Psychosocial Assessment, Interpersonal group therapy.  Evaluation of Outcomes: Progressing   Progress in Treatment: Attending groups: Yes. Participating in groups: Yes. Taking medication as prescribed: Yes. Toleration medication: Yes. Family/Significant other contact made: Yes, individual(s) contacted:  Mother Patient understands diagnosis: Yes. Discussing patient identified problems/goals with staff: Yes. Medical problems stabilized or resolved: Yes. Denies suicidal/homicidal ideation:  Yes. Issues/concerns per patient self-inventory: No.   New problem(s) identified: No, Describe:  None   New Short Term/Long Term Goal(s): medication stabilization, elimination of SI thoughts, development of comprehensive mental wellness plan.   Patient Goals: "To work on my self and make a better life for me and my child"  Discharge Plan or Barriers: Patient recently admitted. CSW will continue to follow and assess for appropriate referrals and possible discharge planning.   Reason for Continuation of Hospitalization: Depression Medication stabilization Suicidal ideation  Estimated Length of Stay: 3 to 5 days   Attendees: Patient: Priscilla Powell  08/20/2020   Physician: Rhea Belton, DO 08/20/2020   Nursing:  08/20/2020   RN Care Manager: 08/20/2020   Social Worker: Melba Coon, LCSWA 08/20/2020   Recreational Therapist:  08/20/2020   Other:  08/20/2020   Other:  08/20/2020   Other: 08/20/2020     Scribe for Treatment Team: Aram Beecham, LCSWA 08/20/2020 11:11 AM

## 2020-08-20 NOTE — [facility] Group Notes (Signed)
Type of Therapy and Topic:  Group Therapy:  Strengths Exploration   Participation Level: Active  Description of Group: This group allows individuals to explore their strengths, learn to use strengths in new ways to improve well-being. Strengths-based interventions involve identifying strengths, understanding how they are used, and learning new ways to apply them. Individuals will identify their strengths, and then explore their roles in different areas of life (relationships, professional life, and personal fulfillment). Individuals will think about ways in which they currently use their strengths, along with new ways they could begin using them.    Therapeutic Goals Patient will verbalize two of their strengths Patient will identify how their strengths are currently used Patient will identify two new ways to apply their strengths  Patients will create a plan to apply their strengths in their daily lives     Summary of Patient Progress:   Priscilla Powell shared that she is good at being very careful.  The Pt participated in the group discussion and shared thoughts and ideas with their peers.  The Pt also accepted the worksheet that was provided.

## 2020-08-20 NOTE — Progress Notes (Signed)
   08/20/20 1000  Psych Admission Type (Psych Patients Only)  Admission Status Voluntary  Psychosocial Assessment  Patient Complaints Depression  Eye Contact Fair  Facial Expression Anxious;Sullen;Sad;Worried  Affect Anxious;Depressed;Sad;Sullen  Speech Logical/coherent  Interaction Assertive  Motor Activity Slow  Appearance/Hygiene Unremarkable  Behavior Characteristics Cooperative  Mood Depressed;Anxious  Thought Process  Coherency WDL  Content Blaming others;Blaming self  Delusions None reported or observed  Perception WDL  Hallucination None reported or observed  Judgment Poor  Confusion None  Danger to Self  Current suicidal ideation? Denies  Self-Injurious Behavior No self-injurious ideation or behavior indicators observed or expressed   Agreement Not to Harm Self Yes  Description of Agreement pt verbally agrees to approach staff before harming self while at [facility]  Danger to Others  Danger to Others None reported or observed

## 2020-08-21 DIAGNOSIS — F411 Generalized anxiety disorder: Secondary | ICD-10-CM

## 2020-08-21 MED ORDER — SERTRALINE HCL 50 MG PO TABS
50.0000 mg | ORAL_TABLET | Freq: Every day | ORAL | Status: DC
  Administered 2020-08-22: 50 mg via ORAL
  Filled 2020-08-21: qty 1
  Filled 2020-08-21: qty 7
  Filled 2020-08-21: qty 1

## 2020-08-21 MED ORDER — SERTRALINE HCL 25 MG PO TABS
25.0000 mg | ORAL_TABLET | Freq: Once | ORAL | Status: AC
  Administered 2020-08-21: 25 mg via ORAL
  Filled 2020-08-21: qty 1
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
## 2020-08-21 NOTE — Plan of Care (Signed)
Has remained calm and cooperative. Denying thoughts of self harm. Denying hallucinations. Attending groups and getting along with peers.

## 2020-08-21 NOTE — Progress Notes (Signed)
[facility] Group Notes:  (Nursing/MHT/Case Management/Adjunct)  Date:  08/21/2020  Time: 2015 Type of Therapy:   wrap up group  Participation Level:  Active  Participation Quality:  Appropriate, Attentive, Sharing, and Supportive  Affect:  Depressed  Cognitive:  Alert  Insight:  Improving  Engagement in Group:  Engaged  Modes of Intervention:  Clarification, Education, and Support  Summary of Progress/Problems: Positive thinking and self-care were discussed.   Marcille Buffy 08/21/2020, 9:26 PM

## 2020-08-21 NOTE — [facility] Suicide Risk Assessment (Signed)
[facility] INPATIENT:  Family/Significant Other Suicide Prevention Education  Suicide Prevention Education:  Education Completed; Britt Theard [phone] (Mother) has been identified by the patient as the family member/significant other with whom the patient will be residing, and identified as the person(s) who will aid the patient in the event of a mental health crisis (suicidal ideations/suicide attempt).  With written consent from the patient, the family member/significant other has been provided the following suicide prevention education, prior to the and/or following the discharge of the patient.  The suicide prevention education provided includes the following: Suicide risk factors Suicide prevention and interventions National Suicide Hotline telephone number [facility] assessment telephone number [facility] Emergency Assistance 911 [facility] and/or Residential Mobile Crisis Unit telephone number  Request made of family/significant other to: Remove weapons (e.g., guns, rifles, knives), all items previously/currently identified as safety concern.   Remove drugs/medications (over-the-counter, prescriptions, illicit drugs), all items previously/currently identified as a safety concern.  The family member/significant other verbalizes understanding of the suicide prevention education information provided.  The family member/significant other agrees to remove the items of safety concern listed above.  CSW spoke with Mrs. Rochon who states that her daughter sounded good when she spoke with her today over the phone.  She states that her daughter has been setting goals and discussing quitting one of her jobs to maintain her health and mental health better.  Mrs. Darley states that her daughter has been overwhelmed recently with her 3 jobs, financial concerns, and her minor child and his diagnosis.  Mrs. Karges states that her daughter does not have any previous diagnoses or suicide  attempts.  She states that this is the first time her daughter has needed any type of mental health medications as well.  Mrs. Trott states that there are no weapons or firearms in the home. Mrs. Grzesiak states that her daughter use to have difficulties swallowing pills but has been doing better in recent years.  CSW completed SPE with Mrs. [address] Lupita Raider 08/21/2020, 3:07 PM

## 2020-08-21 NOTE — [facility] Counselor (Signed)
CSW provided the patient with the phone numbers to her 3 jobs at [employer], [employer], and [employer].  All 3 are located in [place].  The Pt contacted these jobs and states that she will need 3 notes at discharge to show that she was in the hospital.

## 2020-08-22 MED ORDER — TRAZODONE HCL 100 MG PO TABS: 100.0000 mg | ORAL_TABLET | Freq: Every evening | ORAL | 0 refills | Status: AC | PRN

## 2020-08-22 MED ORDER — TRAZODONE HCL 100 MG PO TABS
100.0000 mg | ORAL_TABLET | Freq: Every evening | ORAL | Status: DC | PRN
  Filled 2020-08-22: qty 7

## 2020-08-22 MED ORDER — SERTRALINE HCL 50 MG PO TABS: 50.0000 mg | ORAL_TABLET | Freq: Every day | ORAL | 0 refills | Status: AC

## 2020-08-22 MED ORDER — HYDROXYZINE HCL 25 MG PO TABS: 25.0000 mg | ORAL_TABLET | Freq: Three times a day (TID) | ORAL | 0 refills | Status: AC | PRN

## 2020-08-22 MED ORDER — FAMOTIDINE 40 MG PO TABS: 40.0000 mg | ORAL_TABLET | Freq: Every day | ORAL | 0 refills | Status: AC
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
## 2020-08-22 NOTE — Progress Notes (Addendum)
Pt visible on the unit some this evening, pt stated she was feeling better, pt had a little brighter affect and was more vocal during interaction. Pt given PRN Trazode and Vistaril per [facility]    08/22/20 0000  Psych Admission Type (Psych Patients Only)  Admission Status Voluntary  Psychosocial Assessment  Patient Complaints Depression  Eye Contact Fair  Facial Expression Anxious;Other (Comment) (calm)  Affect Flat  Speech Logical/coherent  Interaction Assertive  Motor Activity Slow  Appearance/Hygiene Unremarkable  Behavior Characteristics Cooperative  Mood Depressed  Thought Process  Coherency WDL  Content WDL  Delusions WDL;None reported or observed  Perception WDL  Hallucination None reported or observed  Judgment Poor  Confusion None  Danger to Self  Current suicidal ideation? Denies  Self-Injurious Behavior No self-injurious ideation or behavior indicators observed or expressed   Agreement Not to Harm Self Yes  Description of Agreement verbal  Danger to Others  Danger to Others None reported or observed
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
## 2020-08-22 NOTE — Progress Notes (Signed)
  [facility] Adult Case Management Discharge Plan :  Will you be returning to the same living situation after discharge:  Yes,  personal home At discharge, do you have transportation home?: Yes,  car in parking lot Do you have the ability to pay for your medications: No.  Release of information consent forms completed and in the chart;  Patient's signature needed at discharge.  Patient to Follow up at:  Follow-up Information     [facility]. Go to.   Specialty: Behavioral Health Why: Please go to this provider for therapy and medication management services during walk in hours:  Monday through Wednesday from 7:45 am to 11:00 am.  Services are provided on a first come, first served basis. Contact information: [address] [place] [address] [phone]        [facility]. Call.   Why: Please call to schedule an appointment for primary care services with this provider, and to obtain a referral to a provider for urine incontinence and to obtain a sleep study. Contact information: [address] [employer] [place] [address]        [facility]. Call.   Why: Please call to inquire about primary care services and fees with this provider. Contact information: [address] [place] [address] [phone]                Next level of care provider has access to [facility] Link:yes  Safety Planning and Suicide Prevention discussed: Yes,  mother     Has patient been referred to the Quitline?: N/A patient is not a smoker  Patient has been referred for addiction treatment: N/A  Felizardo Hoffmann, LCSWA 08/22/2020, 11:05 AM

## 2020-08-22 NOTE — [facility]HH Group Notes (Signed)
Due to the acuity, staffing and complex discharge plans, group was not held. Patient was provided therapeutic worksheets and asked to meet with CSW as needed.   Darianna Amy, LCSWA Clinicial Social Worker [facility]  

## 2020-08-22 NOTE — Final Progress Note (Signed)
Discharge Note:  Patient denies SI/HI AVH at this time. Discharge instructions, AVS, prescriptions and transition record gone over with patient. Patient agrees to comply with medication management, follow-up visit, and outpatient therapy. Patient belongings returned to patient. Patient questions and concerns addressed and answered.  Patient ambulatory off unit.  Patient discharged to home with friend.   

## 2021-03-16 ENCOUNTER — Other Ambulatory Visit: Payer: Self-pay

## 2021-03-16 ENCOUNTER — Emergency Department (HOSPITAL_COMMUNITY)
Admission: EM | Admit: 2021-03-16 | Discharge: 2021-03-17 | Discharge status: Against Medical Advice | Payer: Self-pay | Attending: Emergency Medicine | Admitting: Emergency Medicine

## 2021-03-16 ENCOUNTER — Encounter (HOSPITAL_COMMUNITY): Payer: Self-pay

## 2021-03-16 DIAGNOSIS — Z5321 Procedure and treatment not carried out due to patient leaving prior to being seen by health care provider: Secondary | ICD-10-CM | POA: Insufficient documentation

## 2021-03-16 DIAGNOSIS — M79605 Pain in left leg: Secondary | ICD-10-CM | POA: Insufficient documentation

## 2021-03-16 NOTE — ED Provider Triage Note (Signed)
Emergency Medicine Provider Triage Evaluation Note  Priscilla Powell , a 41 y.o. female  was evaluated in triage.  Pt complains of left leg pain.  States in the knee mostly, shoots up and down the leg.  Has been wearing brace and taking OTC meds without relief.  Denies injury.  Review of Systems  Positive: Left leg pain Negative: numbness  Physical Exam  BP (!) 142/88    Pulse 98    Temp 98.3 F (36.8 C) (Oral)    Resp 20    SpO2 98%  Gen:   Awake, no distress   Resp:  Normal effort  MSK:   Moves extremities without difficulty  Other:  Left knee brace in place.  Medical Decision Making  Medically screening exam initiated at 11:41 PM.  Appropriate orders placed.  Priscilla Powell was informed that the remainder of the evaluation will be completed by another provider, this initial triage assessment does not replace that evaluation, and the importance of remaining in the ED until their evaluation is complete.  Left knee pain radiating up and down leg.  Ambulatory in triage.  Will order x-ray.   Garlon Hatchet, PA-C 03/16/21 [id number]

## 2021-03-16 NOTE — ED Triage Notes (Signed)
Pt reports that she has been having L knee pain for the past few days that shoots down that leg, denies injury.

## 2021-03-17 ENCOUNTER — Emergency Department (HOSPITAL_COMMUNITY): Payer: Self-pay

## 2021-03-17 NOTE — ED Notes (Signed)
Patient had family emergency and has to leave

## 2022-07-27 IMAGING — DX DG KNEE COMPLETE 4+V*L*
4 series · 4 of 4 positions shown · non-contrast
Comparison: None.

CLINICAL DATA: Left knee pain.

EXAM:
LEFT KNEE - COMPLETE 4+ VIEW

[knee ap]
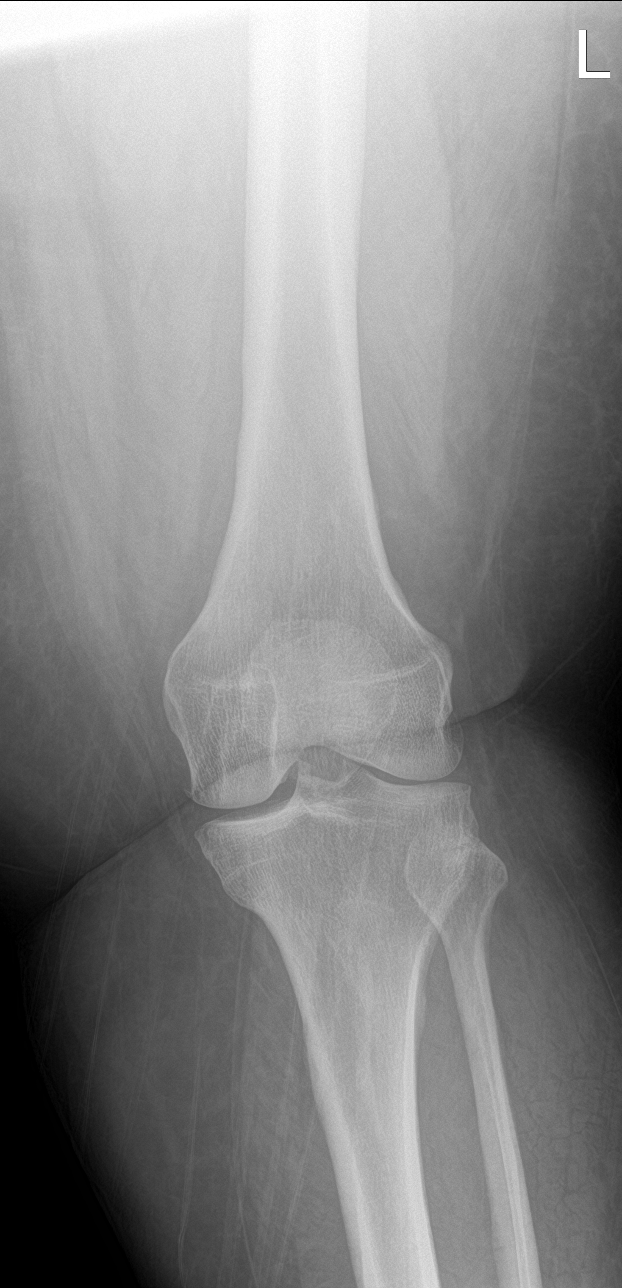

[knee lat]
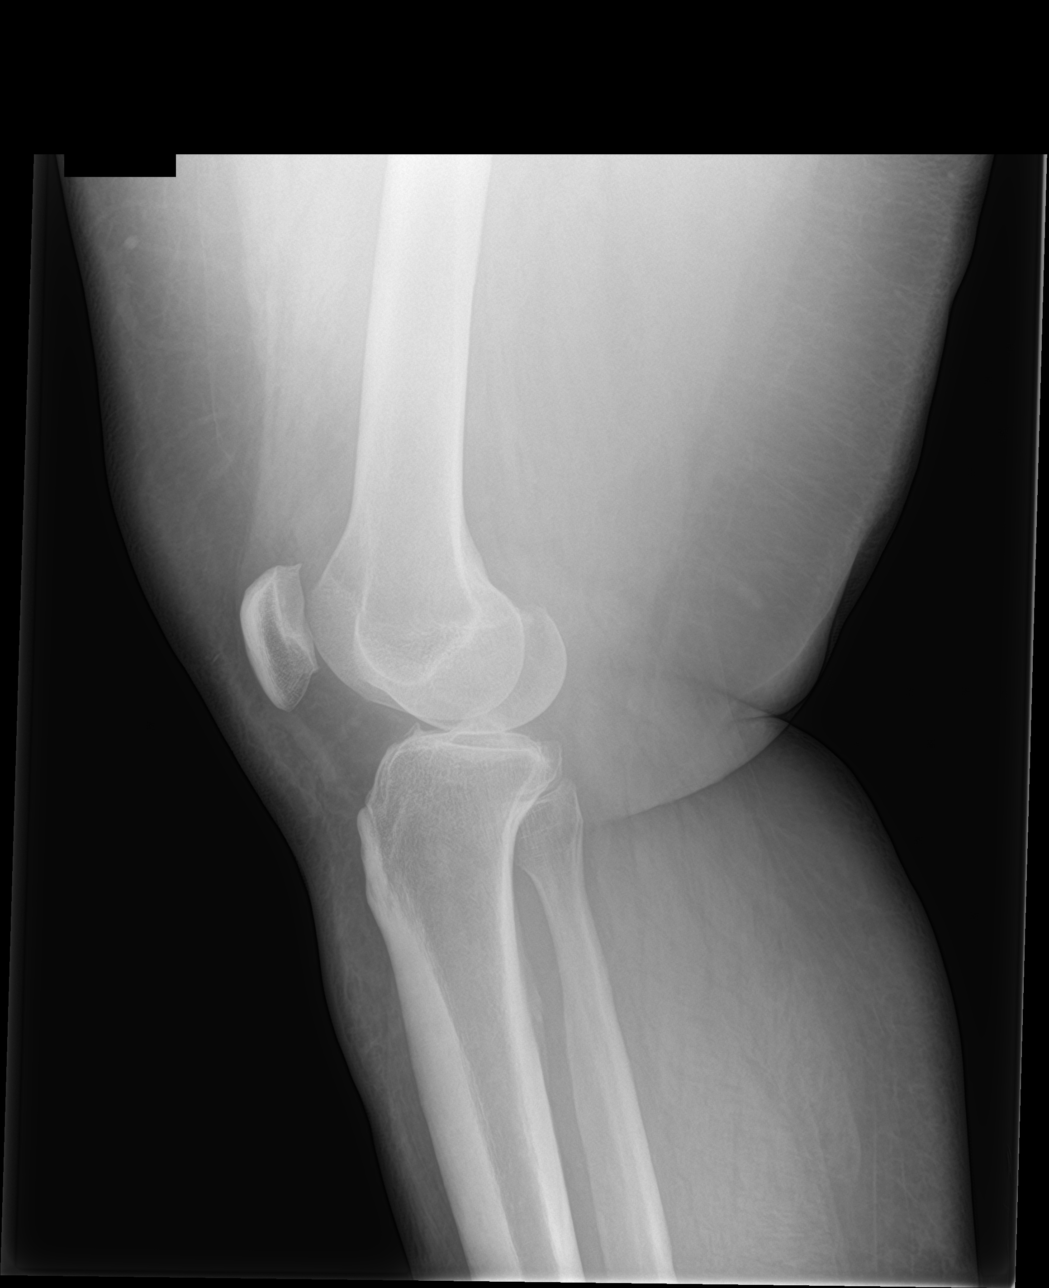

[knee obl (1 of 2)]
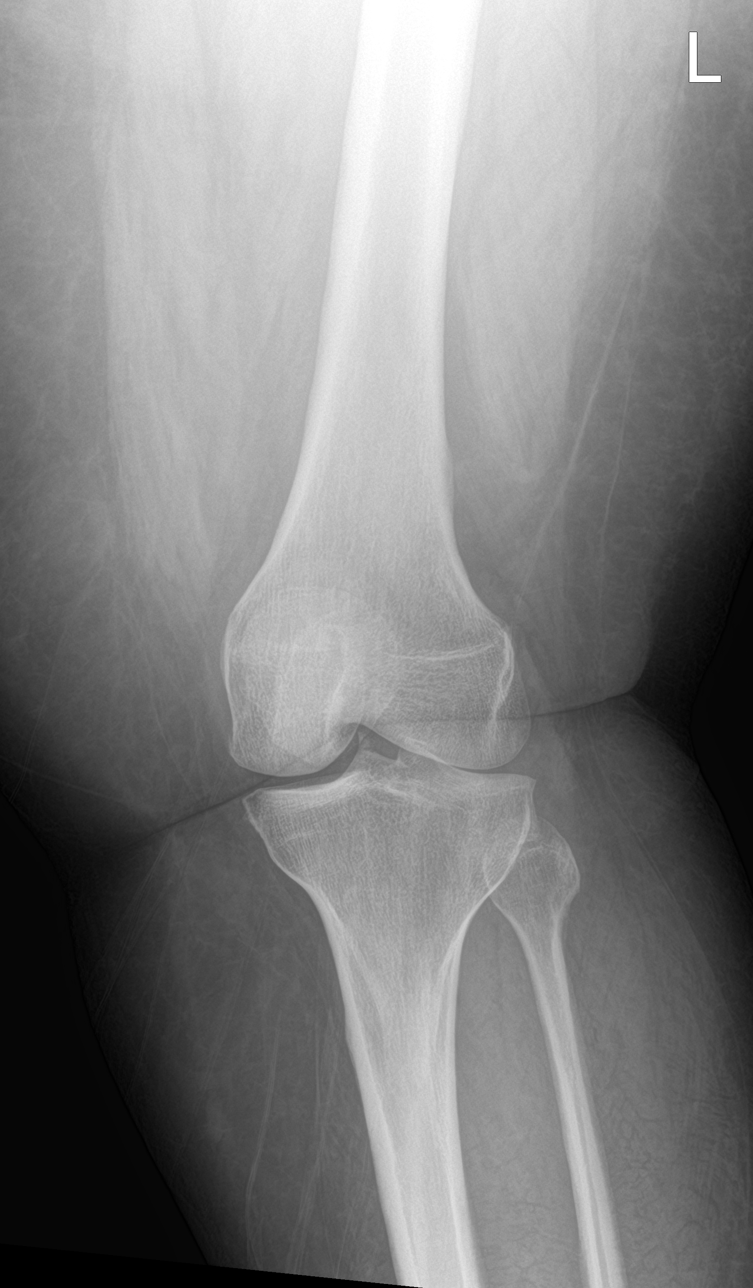

[knee obl (2 of 2)]
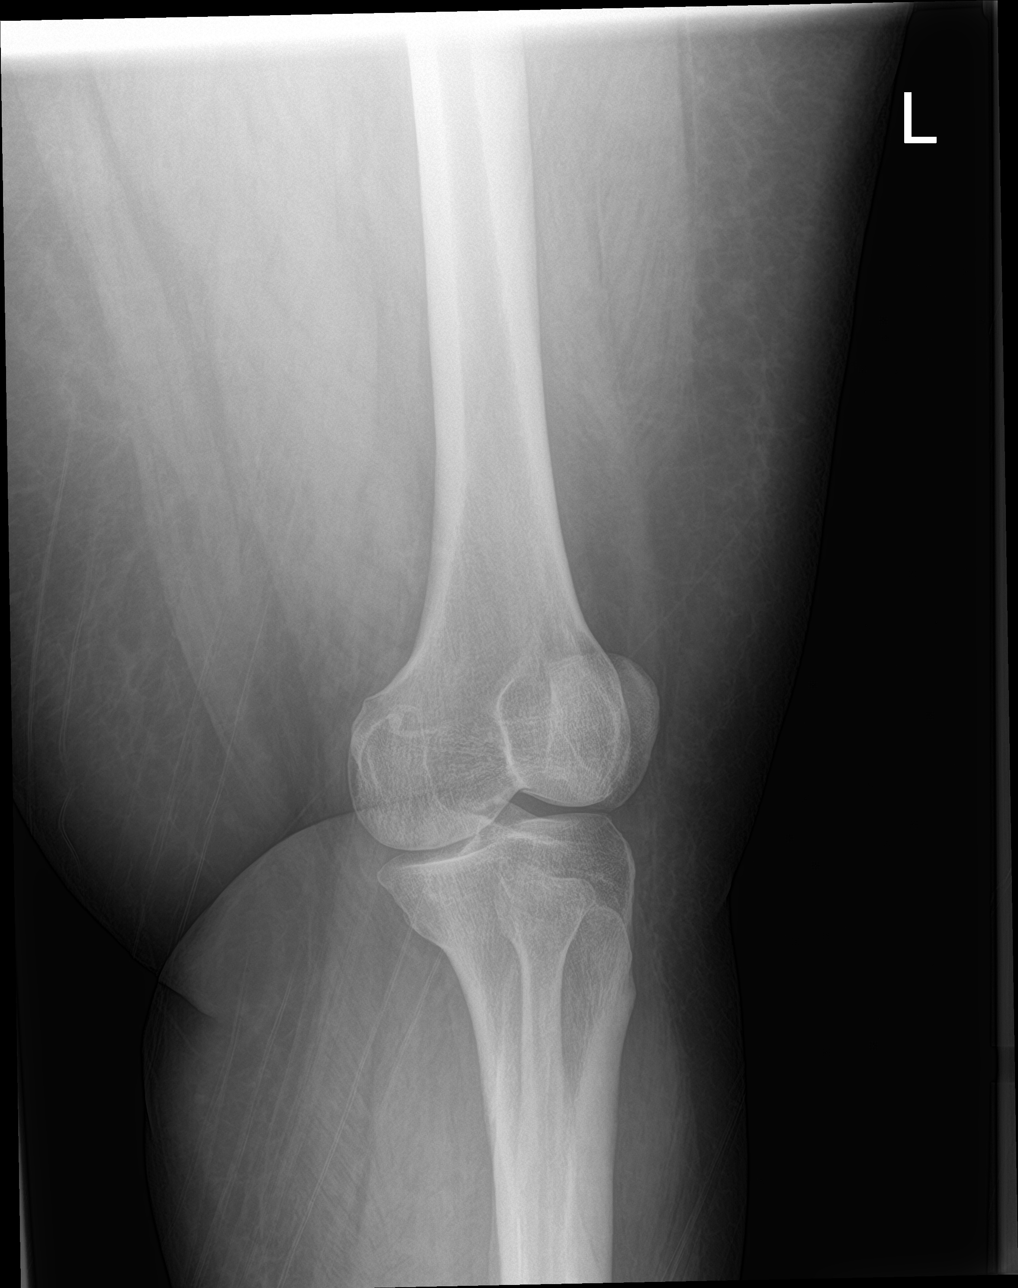

[4 of 4 positions shown; findings below may reference images not displayed]

FINDINGS: No acute fracture or dislocation. Mild-to-moderate tricompartmental
joint space narrowing and osteophyte formation is noted
predominantly in the patellofemoral compartment. There is a trace
suprapatellar joint effusion. Soft tissues are unremarkable.
IMPRESSION: 1. No acute fracture or dislocation.
2. Mild to moderate tricompartmental degenerative changes.
3. Trace suprapatellar joint effusion.
